# Patient Record
Sex: Male | Born: 1985
Health system: Southern US, Community
[De-identification: ages and names within clinical notes are randomized; demographics above are authoritative.]

## PROBLEM LIST (undated history)

## (undated) DIAGNOSIS — F200 Paranoid schizophrenia: Secondary | ICD-10-CM

## (undated) DIAGNOSIS — K5900 Constipation, unspecified: Secondary | ICD-10-CM

---

## 2003-11-14 ENCOUNTER — Emergency Department (HOSPITAL_COMMUNITY): Admission: EM | Admit: 2003-11-14 | Discharge: 2003-11-14 | Payer: Self-pay | Admitting: Emergency Medicine

## 2004-03-28 ENCOUNTER — Emergency Department (HOSPITAL_COMMUNITY): Admission: EM | Admit: 2004-03-28 | Discharge: 2004-03-28 | Payer: Self-pay | Admitting: Emergency Medicine

## 2004-12-18 ENCOUNTER — Emergency Department (HOSPITAL_COMMUNITY): Admission: EM | Admit: 2004-12-18 | Discharge: 2004-12-18 | Payer: Self-pay | Admitting: Emergency Medicine

## 2007-11-27 ENCOUNTER — Emergency Department (HOSPITAL_COMMUNITY): Admission: EM | Admit: 2007-11-27 | Discharge: 2007-11-27 | Payer: Self-pay | Admitting: Emergency Medicine

## 2010-11-30 LAB — CBC
Hemoglobin: 12.1 — ABNORMAL LOW
Platelets: 253
RBC: 4.38
RDW: 14

## 2010-11-30 LAB — BASIC METABOLIC PANEL
Creatinine, Ser: 0.92
GFR calc Af Amer: >60
GFR calc non Af Amer: >60
Glucose, Bld: 120 — ABNORMAL HIGH
Potassium: 2.9 — ABNORMAL LOW
Sodium: 141

## 2010-11-30 LAB — DIFFERENTIAL
Basophils Absolute: 0
Basophils Relative: 0
Eosinophils Absolute: 0
Eosinophils Relative: 0
Neutrophils Relative %: 74

## 2016-04-27 ENCOUNTER — Encounter (HOSPITAL_COMMUNITY): Payer: Self-pay | Admitting: Emergency Medicine

## 2016-04-27 ENCOUNTER — Emergency Department (HOSPITAL_COMMUNITY)
Admission: EM | Admit: 2016-04-27 | Discharge: 2016-04-29 | Disposition: A | Discharge status: Other Acute Inpatient Hospital | Payer: Self-pay

## 2016-04-27 DIAGNOSIS — Z5181 Encounter for therapeutic drug level monitoring: Secondary | ICD-10-CM | POA: Insufficient documentation

## 2016-04-27 DIAGNOSIS — F200 Paranoid schizophrenia: Secondary | ICD-10-CM | POA: Diagnosis present

## 2016-04-27 DIAGNOSIS — F322 Major depressive disorder, single episode, severe without psychotic features: Secondary | ICD-10-CM | POA: Insufficient documentation

## 2016-04-27 LAB — CBC
HCT: 36.1 % — ABNORMAL LOW (ref 39.0–52.0)
HEMOGLOBIN: 12.5 g/dL — AB (ref 13.0–17.0)
MCH: 27.4 pg (ref 26.0–34.0)
MCHC: 34.6 g/dL (ref 30.0–36.0)
MCV: 79.2 fL (ref 78.0–100.0)
Platelets: 173 10*3/uL (ref 150–400)
RBC: 4.56 MIL/uL (ref 4.22–5.81)
RDW: 13.5 % (ref 11.5–15.5)
WBC: 4 10*3/uL (ref 4.0–10.5)

## 2016-04-27 LAB — SALICYLATE LEVEL

## 2016-04-27 LAB — RAPID URINE DRUG SCREEN, HOSP PERFORMED
AMPHETAMINES: NOT DETECTED
Barbiturates: NOT DETECTED
Benzodiazepines: NOT DETECTED
COCAINE: NOT DETECTED
OPIATES: NOT DETECTED
TETRAHYDROCANNABINOL: NOT DETECTED

## 2016-04-27 LAB — COMPREHENSIVE METABOLIC PANEL
ALBUMIN: 4.6 g/dL (ref 3.5–5.0)
ALT: 15 U/L — AB (ref 17–63)
AST: 20 U/L (ref 15–41)
Alkaline Phosphatase: 60 U/L (ref 38–126)
Anion gap: 9 (ref 5–15)
BUN: 12 mg/dL (ref 6–20)
CHLORIDE: 101 mmol/L (ref 101–111)
CO2: 27 mmol/L (ref 22–32)
CREATININE: 0.9 mg/dL (ref 0.61–1.24)
Calcium: 9.6 mg/dL (ref 8.9–10.3)
GFR calc non Af Amer: >60 mL/min (ref >60)
GLUCOSE: 129 mg/dL — AB (ref 65–99)
Potassium: 3 mmol/L — ABNORMAL LOW (ref 3.5–5.1)
SODIUM: 137 mmol/L (ref 135–145)
Total Bilirubin: 0.5 mg/dL (ref 0.3–1.2)
Total Protein: 7.4 g/dL (ref 6.5–8.1)

## 2016-04-27 LAB — ETHANOL: Alcohol, Ethyl (B): <5 mg/dL (ref <5)

## 2016-04-27 LAB — ACETAMINOPHEN LEVEL: Acetaminophen (Tylenol), Serum: <10 ug/mL — ABNORMAL LOW (ref 10–30)

## 2016-04-27 MED ORDER — POTASSIUM CHLORIDE CRYS ER 20 MEQ PO TBCR
40.0000 meq | EXTENDED_RELEASE_TABLET | Freq: Once | ORAL | Status: AC
  Administered 2016-04-27: 40 meq via ORAL
  Filled 2016-04-27: qty 2

## 2016-04-27 MED ORDER — SODIUM CHLORIDE 0.9 % IV BOLUS (SEPSIS)
2000.0000 mL | Freq: Once | INTRAVENOUS | Status: AC
  Administered 2016-04-27: 2000 mL via INTRAVENOUS

## 2016-04-27 MED ORDER — SODIUM CHLORIDE 0.9 % IV SOLN
INTRAVENOUS | Status: DC
  Administered 2016-04-27: 17:00:00 via INTRAVENOUS

## 2016-04-27 MED ORDER — ONDANSETRON HCL 4 MG PO TABS: 4.0000 mg | ORAL_TABLET | Freq: Three times a day (TID) | ORAL | Status: DC | PRN

## 2016-04-27 MED ORDER — ACETAMINOPHEN 325 MG PO TABS: 650.0000 mg | ORAL_TABLET | ORAL | Status: DC | PRN

## 2016-04-27 MED ORDER — IBUPROFEN 200 MG PO TABS: 600.0000 mg | ORAL_TABLET | Freq: Three times a day (TID) | ORAL | Status: DC | PRN

## 2016-04-27 NOTE — Progress Notes (Signed)
ED CM  Noted patient not to  Have PCP or health insurance, will follow up for discharge needs.

## 2016-04-27 NOTE — Progress Notes (Signed)
CSW Followed up on remaining  bed placement referrals:  [facility]Being [facility]-denied due to perception that pt. requires LTC.  Timmothy EulerJean T. Kaylyn LimSutter, MSW, LCSWA Clinical Social Work Disposition [phone]

## 2016-04-27 NOTE — BH Assessment (Signed)
Assessment Note   Randy Fry is an 31 y.o. male who was brought to the ED under IVC by his brother for bizarre behavior, refusing to come to door or out of house, poor nutrition, hygiene and unsanitary living conditions. Pt was paranoid and could not appropriately answer most assessment questions and refused to answer others. Pt continued to say "It should be in my chart, check my chart" when writer asked him questions about why he is in the hospital. Pt does not have much history with the [facility] so there is not much background information on patient in chart at this time. Pt denies SI, HI or AVH but appears to be very paranoid, withdrawn and fearful of being in the hospital. When asked if he has ever seen a psychiatrist or been inpatient he stated that "he thinks so when he was an adolescent". He denies being treated for anything recently which was confirmed by his brother who took out the IVC papers. Pt states that he does not work and he goes out of his house "once a month". When asked how he is able to pay for things to survive he states that he "has income coming in". When writer asked him to elaborate he stated "it's nothing illegal" but would not say anything else. Pt denies SI or any previous attempts. He denies  HI or AVH however provider is concerned that he might be responding to internal stimuli. Pt is in an almost catatonic state, is slow moving and refusing to eat anything.   Collateral information: Writer called brother to get collateral information since pt was not able to successfully answer most questions. Brother Ludger Nutting Burkle [phone]) states that pt has withdrawn from family for about 5-6 years and will not interact with anyone other than to accept food every couple of weeks. He states that he had a break up with a girlfriend around this time and started to decline in basic functioning. Brother stated that the apartment he was living in at the time was dishevled,  unsanitary with food left out and maggots everywhere. He was moved to a house that his family owned (where he is residing now) and since then he will not communicate much with family. Brother stated that their mom has been diagnosed with Major Depressive Disorder and Schizophrenia and he is concerned that the patient might have similar issues. Brother IVC'd the patient because he would not answer the door when he went to check on him and when he finally got in the house was unsanitary with mold everywhere, slimy film on the floor and he was unkept with poor hygiene and would not speak to him. He states that he brought him to [facility] where he appeared paranoid and kept stating to the nurse to "check his chart" even though he had never been to [place] before. Brother stated that his IQ is average and he completed high school and some college. Brother is concerned that patient hasn't eaten in at least 2 weeks. Patient is still refusing to eat in hosptial. Brother states that pt has never been diagnosed or treated for psychiatric issues that he knows of even though he has presented with bizarre behavior for some time.   Disposition: inpatient recommended per Nanine Means NP.   Diagnosis: unspecified psychotic disorder, unspecified depressive disorder ( R/O major depressive disorder with psychotic features)   Past Medical History: History reviewed. No pertinent past medical history.  History reviewed. No pertinent surgical history.  Family History: No  family history on file.  Social History:  has no tobacco, alcohol, and drug history on file.  Additional Social History:  Alcohol / Drug Use History of alcohol / drug use?: No history of alcohol / drug abuse  CIWA: CIWA-Ar BP: 131/74 Pulse Rate: 119 COWS:    PATIENT STRENGTHS: (choose at least two) General fund of knowledge Supportive family/friends  Allergies: No Known Allergies  Home Medications:  (Not in a hospital admission)  OB/GYN  Status:  No LMP for male patient.  General Assessment Data [facility]: [facility] T[facility] Assessment: In system Is this a Tele or Face-to-Face Assessment?: Face-to-Face Is this an Initial Assessment or a Re-assessment for this encounter?: Initial Assessment Marital status: Single Living Arrangements: Alone Can pt return to current living arrangement?: Yes Admission Status: Involuntary Is patient capable of signing voluntary admission?: No Referral Source: Self/Family/Friend Insurance type:  (none)     Crisis Care Plan Living Arrangements: Alone Name of Psychiatrist: None Name of Therapist: None  Education Status Is patient currently in school?: No Highest grade of school patient has completed: yes  Risk to self with the past 6 months Suicidal Ideation: No Has patient been a risk to self within the past 6 months prior to admission? : No Suicidal Intent: No Has patient had any suicidal intent within the past 6 months prior to admission? : No Is patient at risk for suicide?: No Suicidal Plan?: No Has patient had any suicidal plan within the past 6 months prior to admission? : No Access to Means: No What has been your use of drugs/alcohol within the last 12 months?: denies Previous Attempts/Gestures: No How many times?: 0 Other Self Harm Risks: no Triggers for Past Attempts: None known Intentional Self Injurious Behavior: None Family Suicide History: No Recent stressful life event(s): Other (Comment) Persecutory voices/beliefs?: No Depression: Yes Depression Symptoms: Despondent Substance abuse history and/or treatment for substance abuse?: No Suicide prevention information given to non-admitted patients: Not applicable  Risk to Others within the past 6 months Homicidal Ideation: No Does patient have any lifetime risk of violence toward others beyond the six months prior to admission? : No Thoughts of Harm to Others: No Current Homicidal Intent: No Current  Homicidal Plan: No Access to Homicidal Means: No Identified Victim: None History of harm to others?: No Assessment of Violence: None Noted Violent Behavior Description: no Does patient have access to weapons?:  (unknown) Criminal Charges Pending?: No Does patient have a court date: No Is patient on probation?: No  Psychosis Hallucinations:  (Denies- provider concerned) Delusions:  (denies- provider concerned)  Mental Status Report Appearance/Hygiene: Bizarre Eye Contact: Poor Motor Activity: Freedom of movement, Restlessness Speech: Soft, Slow Level of Consciousness: Quiet/awake Mood: Suspicious Affect: Blunted Anxiety Level: Severe Thought Processes: Thought Blocking Judgement: Impaired Orientation: Not oriented Obsessive Compulsive Thoughts/Behaviors: Moderate  Cognitive Functioning Concentration: Decreased Memory: Recent Intact, Remote Impaired IQ: Average Insight: Fair Impulse Control: Fair Appetite: Poor (hasnt eaten in 2 weeks per brother) Weight Loss: 0 Weight Gain: 0 Sleep: Unable to Assess Vegetative Symptoms: Staying in bed, Not bathing, Decreased grooming  ADLScreening [facility]) Patient's cognitive ability adequate to safely complete daily activities?: Yes Patient able to express need for assistance with ADLs?: Yes Independently performs ADLs?: Yes (appropriate for developmental age)  Prior Inpatient Therapy Prior Inpatient Therapy: No  Prior Outpatient Therapy Prior Outpatient Therapy: No Does patient have an ACCT team?: No Does patient have Intensive In-House Services?  : No Does patient have [facility] services? :  No Does patient have P4CC services?: No  ADL Screening (condition at time of admission) Patient's cognitive ability adequate to safely complete daily activities?: Yes Is the patient deaf or have difficulty hearing?: No Does the patient have difficulty seeing, even when wearing glasses/contacts?: No Does the patient have  difficulty concentrating, remembering, or making decisions?: No Patient able to express need for assistance with ADLs?: Yes Does the patient have difficulty dressing or bathing?: No Independently performs ADLs?: Yes (appropriate for developmental age) Does the patient have difficulty walking or climbing stairs?: No Weakness of Legs: None Weakness of Arms/Hands: None  Home Assistive Devices/Equipment Home Assistive Devices/Equipment: None  Therapy Consults (therapy consults require a physician order) PT Evaluation Needed: No OT Evalulation Needed: No SLP Evaluation Needed: No Abuse/Neglect Assessment (Assessment to be complete while patient is alone) Physical Abuse: Denies Verbal Abuse: Denies Sexual Abuse: Denies Exploitation of patient/patient's resources: Denies Self-Neglect: Yes, present (Comment) Values / Beliefs Cultural Requests During Hospitalization: None Spiritual Requests During Hospitalization: None Consults Spiritual Care Consult Needed: No Social Work Consult Needed: No Merchant navy officer (For Healthcare) Does Patient Have a Medical Advance Directive?: No Would patient like information on creating a medical advance directive?: No - Patient declined Nutrition Screen- [facility] Patient's home diet: Regular Has the patient recently lost weight without trying?: No Has the patient been eating poorly because of a decreased appetite?: No Malnutrition Screening Tool Score: 0  Additional Information 1:1 In Past 12 Months?: No CIRT Risk: No Elopement Risk: No Does patient have medical clearance?: No     Disposition:  Disposition Initial Assessment Completed for this Encounter: Yes Disposition of Patient: Inpatient treatment program Type of inpatient treatment program: Adult  Cire Deyarmin 04/27/2016 3:06 PM

## 2016-04-27 NOTE — Progress Notes (Deleted)
Pt. Accepted to [facility]. but pt. requires a special bed that will need to be ordered in the AM before patient can be admitted to [facility]. CSW notified [facility] ED.    Oviya Ammar T. Vonya Ohalloran, MSW, LCSWA Clinical Social Work Disposition [phone] 

## 2016-04-27 NOTE — ED Notes (Signed)
Pt transferred from TCU, presents for evaluation after stopping communication with family.  Living space was unsafe and unsanitary.  Pt denies SI, HI or AVH.  Denies street drugs or alcohol use.  Denies feeling hopeless.  Denies previous history of mental illness.  Awake, alert & responsive, no distress noted.  Calm & cooperative, monitoring for safety, Q 15 min checks in effect.  Safety check for contraband completed, no items found.

## 2016-04-27 NOTE — Progress Notes (Signed)
[facility] is not appropriate for their acute stay facility due to their perception of the pt's need for long-term care.  Dorothe PeaJonathan F. Carlous Olivares, Theresia MajorsLCSWA, LCAS Clinical Social Worker Ph: [phone]

## 2016-04-27 NOTE — ED Notes (Signed)
[place] given to Latricia,[place] in [facility]. Pt moved to rm 35 with one patient belonging bag.

## 2016-04-27 NOTE — ED Notes (Signed)
Pt refusing to have any visitors at this time. Pt's brother, Benjamine Mola came to visit but pt refused to see him at this time. Pt does not want any information given to family members during his stay at the hospital.

## 2016-04-27 NOTE — BH Assessment (Signed)
[facility] Assessment Progress Note  Per Thedore MinsMojeed Akintayo, MD, this voluntary pt requires psychiatric hospitalization at this time.  The following facilities have been contacted to seek placement for this pt, with results as noted:  Beds available, information sent, decision pending:  [facility]   At capacity:  [facility]    Doylene Canninghomas Taimur Fier, [place] Triage Specialist [phone]

## 2016-04-27 NOTE — ED Provider Notes (Signed)
[facility] DEPT Provider Note   CSN: [id number] Arrival date & time: 04/27/16  1219     History   Chief Complaint Chief Complaint  Patient presents with  . IVC    HPI Exodus Skoog is a 31 y.o. male.  31 year old male who presents in her IVC due to not being take care of himself. According to the paperwork, patient is a danger to self due to his lack of eating as well as personal hygiene. He denies any prior psychiatric history. He is not very communicative at this time. He denies chest or abdominal discomfort. Denies any weakness. Denies any suicidal or homicidal ideations. Denies responding to internal stimuli. No auditory or visual hallucinations.      History reviewed. No pertinent past medical history.  There are no active problems to display for this patient.   History reviewed. No pertinent surgical history.     Home Medications    Prior to Admission medications   Not on File    Family History No family history on file.  Social History Social History  Substance Use Topics  . Smoking status: Not on file  . Smokeless tobacco: Not on file  . Alcohol use Not on file     Allergies   Patient has no known allergies.   Review of Systems Review of Systems  Unable to perform ROS: Psychiatric disorder     Physical Exam Updated Vital Signs BP 128/82 (BP Location: Left Arm)   Pulse (!) 135   Temp 98.4 F (36.9 C) (Oral)   Resp 20   Ht 5\' 8"  (1.727 m)   SpO2 100%   Physical Exam  Constitutional: He is oriented to person, place, and time. He appears well-developed and well-nourished.  Non-toxic appearance. No distress.  HENT:  Head: Normocephalic and atraumatic.  Eyes: Conjunctivae, EOM and lids are normal. Pupils are equal, round, and reactive to light.  Neck: Normal range of motion. Neck supple. No tracheal deviation present. No thyroid mass present.  Cardiovascular: Regular rhythm and normal heart sounds.  Tachycardia present.  Exam  reveals no gallop.   No murmur heard. Pulmonary/Chest: Effort normal and breath sounds normal. No stridor. No respiratory distress. He has no decreased breath sounds. He has no wheezes. He has no rhonchi. He has no rales.  Abdominal: Soft. Normal appearance and bowel sounds are normal. He exhibits no distension. There is no tenderness. There is no rebound and no CVA tenderness.  Musculoskeletal: Normal range of motion. He exhibits no edema or tenderness.  Neurological: He is alert and oriented to person, place, and time. He has normal strength. No cranial nerve deficit or sensory deficit. GCS eye subscore is 4. GCS verbal subscore is 5. GCS motor subscore is 6.  Skin: Skin is warm and dry. No abrasion and no rash noted.  Psychiatric: His speech is normal. His affect is blunt. He is withdrawn. He is inattentive.  Nursing note and vitals reviewed.    ED Treatments / Results  Labs (all labs ordered are listed, but only abnormal results are displayed) Labs Reviewed  COMPREHENSIVE METABOLIC PANEL  ETHANOL  SALICYLATE LEVEL  ACETAMINOPHEN LEVEL  CBC  RAPID URINE DRUG SCREEN, HOSP PERFORMED    EKG  EKG Interpretation None       Radiology No results found.  Procedures Procedures (including critical care time)  Medications Ordered in ED Medications - No data to display   Initial Impression / Assessment and Plan / ED Course  I have reviewed the  triage vital signs and the nursing notes.  Pertinent labs & imaging results that were available during my care of the patient were reviewed by me and considered in my medical decision making (see chart for details).     Patient's tachycardia noted. Suspect that he is dehydrated. Patient to be rehydrated. t. Labs are pending at this time.  3:03 PM Patient reassessed and appears to be somewhat paranoid. Suspect this also consider being to his tachycardia. Medically clear for psychiatric evaluation.  Final Clinical Impressions(s) / ED  Diagnoses   Final diagnoses:  None    New Prescriptions New Prescriptions   No medications on file     Lorre NickAnthony Jasyn Mey, MD 04/27/16 [phone]

## 2016-04-27 NOTE — ED Triage Notes (Signed)
Patient BIB [facility], IVC paperwork states "the respondent stopped communicating regularly with his family a few years ago. Recently, the respondent refused to come out of his home or answer the door for his family members. After attempting to make contact numerous time, the petitioner finally convinced the respondent to open the door for him. The petitioner observed a space that was unsafe and unsanitary. According to the petitioner, mold and mildew is growing various places in the home, there is no food in the refrigerator and a sink is missing in the bathroom. According to the petitioner, respondent is not eating or tending to personal hygiene and he sleeps a lot. The respondent refuses to communicate at a meaningful level, stares off into space and will not deal with the reality of his situation. As a result, the respondent is a danger to himself."

## 2016-04-28 DIAGNOSIS — F200 Paranoid schizophrenia: Secondary | ICD-10-CM

## 2016-04-28 DIAGNOSIS — Z79899 Other long term (current) drug therapy: Secondary | ICD-10-CM

## 2016-04-28 MED ORDER — MIRTAZAPINE 7.5 MG PO TABS
7.5000 mg | ORAL_TABLET | Freq: Every day | ORAL | Status: DC
  Administered 2016-04-28: 7.5 mg via ORAL
  Filled 2016-04-28: qty 1

## 2016-04-28 MED ORDER — HALOPERIDOL 5 MG PO TABS
5.0000 mg | ORAL_TABLET | Freq: Two times a day (BID) | ORAL | Status: DC
  Administered 2016-04-28 – 2016-04-29 (×3): 5 mg via ORAL
  Filled 2016-04-28 (×3): qty 1

## 2016-04-28 MED ORDER — ASENAPINE MALEATE 5 MG SL SUBL: 5.0000 mg | SUBLINGUAL_TABLET | Freq: Two times a day (BID) | SUBLINGUAL | Status: DC | PRN

## 2016-04-28 MED ORDER — BENZTROPINE MESYLATE 0.5 MG PO TABS
0.5000 mg | ORAL_TABLET | Freq: Two times a day (BID) | ORAL | Status: DC
  Administered 2016-04-28 – 2016-04-29 (×3): 0.5 mg via ORAL
  Filled 2016-04-28 (×3): qty 1

## 2016-04-28 NOTE — Care Management (Signed)
ED CM received call from patient's brother Randy Fry, brother states, that patient has a long  hx of psychiatric issues and had been on [place] in [place]. [facility] BH [facility]. Family moved patient here several months ago after family stepping in to assist with care.  Brother Randy Fry rented an apartment in which the brother pays the expenses. Brother reports going over to check on him yesterday and he was lying on the floor weak unable to get up without assistance.  Patient told brother he had not eaten in over month, and was not able to answer questions appropriately. [facility] and he refused to answer questions and was discharged. Brother felt that he is no longer able to care for self and would like to explore group home placement.

## 2016-04-28 NOTE — BH Assessment (Signed)
[facility] Assessment Progress Note  Per Thedore MinsMojeed Akintayo, MD, this pt continues to require psychiatric hospitalization at this time.  The following facilities have been contacted to seek placement for this pt, with results as noted:  Beds available, information sent, decision pending:  [facility]   Declined:  [facility] (due to [facility] acuity) [facility] (due to need for long term care) [facility] (due to pt acuity)   At capacity:  [facility]  Lineth Thielke, [place] Triage Specialist [phone]

## 2016-04-28 NOTE — ED Notes (Signed)
Patient has been in his room most of the day.  He comes out to use the bathroom.  He has been asked several times today to shower and he has not done so yet.  He was given all the supplies he needs and was given shower shoes per his request.  He denies thoughts of harm to self or others.  He denies auditory or visual hallucinations, but does seem to be responding to internal stimuli at times.  He was started on haloperidol which he took without incident.

## 2016-04-28 NOTE — ED Notes (Signed)
Patients brother, Randy NuttingDevon, phone number is [phone].

## 2016-04-28 NOTE — ED Notes (Signed)
Pt awake, alert & responsive, no distress noted, calm & cooperative at present.  Watching TV at present.  Monitoring for safety, Q 15 min checks in effect.

## 2016-04-28 NOTE — Consult Note (Signed)
Psychiatry Consult   Reason for Consult: psychiatric evaluation Referring Physician: EDP Patient Identification: Randy Fry MRN:  [id number] Principal Diagnosis: Paranoid schizophrenia ([place]) Diagnosis:   Patient Active Problem List   Diagnosis Date Noted  . Paranoid schizophrenia ([place]) [F20.0] 04/28/2016    Priority: High    Total Time spent with patient: 45 minutes  Subjective:   Randy Fry is a 31 y.o. male patient admitted with bizarre behavior.  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 Patient has long history of mental illness with prior inpatient psychiatric admission to an hospital in [place]. Patient was brought to the [facility] under IVC by his brother for bizarre behavior, refusing to come to door or out of house and self isolation. Patient has not been attending to his personal hygiene and living in an unsanitary conditions. Patient is paranoid, refusing to answer questions except that he wants to be discharged back to his home. He is not currently taking any medications, he is withdrawn, with no interaction with family members. Patient is observed starring into the space and talking to himself as if responding to stimuli.  Past Psychiatric History: as above  Risk to Self: Suicidal Ideation: No Suicidal Intent: No Is patient at risk for suicide?: No Suicidal Plan?: No Access to Means: No What has been your use of drugs/alcohol within the last 12 months?: denies How many times?: 0 Other Self Harm Risks: no Triggers for Past Attempts: None known Intentional Self Injurious Behavior: None Risk to Others: Homicidal Ideation: No Thoughts of Harm to Others: No Current Homicidal Intent: No Current Homicidal Plan: No Access to Homicidal Means: No Identified Victim: None History of harm to others?: No Assessment of Violence: None Noted Violent Behavior Description:  no Does patient have access to weapons?:  (unknown) Criminal Charges Pending?: No Does patient have a court date: No Prior Inpatient Therapy: Prior Inpatient Therapy: No Prior Outpatient Therapy: Prior Outpatient Therapy: No Does patient have an ACCT team?: No Does patient have Intensive In-House Services?  : No Does patient have [facility] services? : No Does patient have P4CC services?: No  Past Medical History: History reviewed. No pertinent past medical history. History reviewed. No pertinent surgical history. Family History: No family history on file. Family Psychiatric  History:  Social History:  History  Alcohol use Not on file     History  Drug use: Unknown    Social History   Social History  . Marital status: Married    Spouse name: N/A  . Number of children: N/A  . Years of education: N/A   Social History Main Topics  . Smoking status: None  . Smokeless tobacco: None  . Alcohol use None  . Drug use: Unknown  . Sexual activity: Not Asked   Other Topics Concern  . None   Social History Narrative  . None   Additional Social History:    Allergies:  No Known Allergies  Labs:  Results for orders placed or performed during the hospital encounter of 04/27/16 (from the past 48 hour(s))  Rapid urine drug screen (hospital performed)     Status: None   Collection Time: 04/27/16 12:37 PM  Result Value Ref Range   Opiates NONE DETECTED NONE DETECTED   Cocaine NONE DETECTED NONE DETECTED   Benzodiazepines NONE DETECTED NONE DETECTED   Amphetamines NONE DETECTED NONE DETECTED   Tetrahydrocannabinol NONE DETECTED NONE DETECTED  Barbiturates NONE DETECTED NONE DETECTED    Comment:        DRUG SCREEN FOR MEDICAL PURPOSES ONLY.  IF CONFIRMATION IS NEEDED FOR ANY PURPOSE, NOTIFY LAB WITHIN 5 DAYS.        LOWEST DETECTABLE LIMITS FOR URINE DRUG SCREEN Drug Class       Cutoff (ng/mL) Amphetamine      1000 Barbiturate      200 Benzodiazepine   160 Tricyclics        737 Opiates          300 Cocaine          300 THC              50   Comprehensive metabolic panel     Status: Abnormal   Collection Time: 04/27/16 12:57 PM  Result Value Ref Range   Sodium 137 135 - 145 mmol/L   Potassium 3.0 (L) 3.5 - 5.1 mmol/L   Chloride 101 101 - 111 mmol/L   CO2 27 22 - 32 mmol/L   Glucose, Bld 129 (H) 65 - 99 mg/dL   BUN 12 6 - 20 mg/dL   Creatinine, Ser 0.90 0.61 - 1.24 mg/dL   Calcium 9.6 8.9 - 10.3 mg/dL   Total Protein 7.4 6.5 - 8.1 g/dL   Albumin 4.6 3.5 - 5.0 g/dL   AST 20 15 - 41 U/L   ALT 15 (L) 17 - 63 U/L   Alkaline Phosphatase 60 38 - 126 U/L   Total Bilirubin 0.5 0.3 - 1.2 mg/dL   GFR calc non Af Amer >60 >60 mL/min   GFR calc Af Amer >60 >60 mL/min    Comment: (NOTE) The eGFR has been calculated using the CKD EPI equation. This calculation has not been validated in all clinical situations. eGFR's persistently <60 mL/min signify possible Chronic Kidney Disease.    Anion gap 9 5 - 15  Ethanol     Status: None   Collection Time: 04/27/16 12:57 PM  Result Value Ref Range   Alcohol, Ethyl (B) <5 <5 mg/dL    Comment:        LOWEST DETECTABLE LIMIT FOR SERUM ALCOHOL IS 5 mg/dL FOR MEDICAL PURPOSES ONLY   Salicylate level     Status: None   Collection Time: 04/27/16 12:57 PM  Result Value Ref Range   Salicylate Lvl <1.0 2.8 - 30.0 mg/dL  Acetaminophen level     Status: Abnormal   Collection Time: 04/27/16 12:57 PM  Result Value Ref Range   Acetaminophen (Tylenol), Serum <10 (L) 10 - 30 ug/mL    Comment:        THERAPEUTIC CONCENTRATIONS VARY SIGNIFICANTLY. A RANGE OF 10-30 ug/mL MAY BE AN EFFECTIVE CONCENTRATION FOR MANY PATIENTS. HOWEVER, SOME ARE BEST TREATED AT CONCENTRATIONS OUTSIDE THIS RANGE. ACETAMINOPHEN CONCENTRATIONS >150 ug/mL AT 4 HOURS AFTER INGESTION AND >50 ug/mL AT 12 HOURS AFTER INGESTION ARE OFTEN ASSOCIATED WITH TOXIC REACTIONS.   cbc     Status: Abnormal   Collection Time: 04/27/16 12:57 PM  Result Value  Ref Range   WBC 4.0 4.0 - 10.5 K/uL   RBC 4.56 4.22 - 5.81 MIL/uL   Hemoglobin 12.5 (L) 13.0 - 17.0 g/dL   HCT 36.1 (L) 39.0 - 52.0 %   MCV 79.2 78.0 - 100.0 fL   MCH 27.4 26.0 - 34.0 pg   MCHC 34.6 30.0 - 36.0 g/dL   RDW 13.5 11.5 - 15.5 %   Platelets 173 150 - 400 K/uL  Current Facility-Administered Medications  Medication Dose Route Frequency Provider Last Rate Last Dose  . 0.9 %  sodium chloride infusion   Intravenous Continuous Lacretia Leigh, MD   Stopped at 04/27/16 1951  . acetaminophen (TYLENOL) tablet 650 mg  650 mg Oral Q4H PRN Lacretia Leigh, MD      . asenapine (SAPHRIS) sublingual tablet 5 mg  5 mg Sublingual Q12H PRN Corena Pilgrim, MD      . benztropine (COGENTIN) tablet 0.5 mg  0.5 mg Oral BID Sho Salguero, MD      . haloperidol (HALDOL) tablet 5 mg  5 mg Oral BID Cannan Beeck, MD      . ibuprofen (ADVIL,MOTRIN) tablet 600 mg  600 mg Oral Q8H PRN Lacretia Leigh, MD      . mirtazapine (REMERON) tablet 7.5 mg  7.5 mg Oral QHS Evren Shankland, MD      . ondansetron (ZOFRAN) tablet 4 mg  4 mg Oral Q8H PRN Lacretia Leigh, MD       No current outpatient prescriptions on file.    Musculoskeletal: Strength & Muscle Tone: within normal limits Gait & Station: normal Patient leans: N/A  Psychiatric Specialty Exam: Physical Exam  Psychiatric: Judgment and thought content normal. His affect is blunt. His speech is delayed. He is slowed, withdrawn and actively hallucinating. Cognition and memory are normal.    Review of Systems  Constitutional: Positive for malaise/fatigue.  HENT: Negative.   Eyes: Negative.   Respiratory: Negative.   Cardiovascular: Negative.   Gastrointestinal: Negative.   Genitourinary: Negative.   Musculoskeletal: Negative.   Skin: Negative.   Neurological: Negative.   Endo/Heme/Allergies: Negative.   Psychiatric/Behavioral: Positive for depression and hallucinations.    Blood pressure 113/68, pulse 109, temperature 98.1 F (36.7 C),  temperature source Oral, resp. rate 20, height '5\' 8"'  (1.727 m), SpO2 100 %.There is no height or weight on file to calculate BMI.  General Appearance: Disheveled  Eye Contact:  Minimal  Speech:  Slow  Volume:  Decreased  Mood:  Depressed and Dysphoric  Affect:  Constricted  Thought Process:  Disorganized  Orientation:  Full (Time, Place, and Person)  Thought Content:  Illogical, Delusions and Hallucinations: Auditory  Suicidal Thoughts:  No  Homicidal Thoughts:  No  Memory:  unable to assess  Judgement:  Poor  Insight:  Lacking  Psychomotor Activity:  Psychomotor Retardation  Concentration:  Concentration: Poor and Attention Span: Fair  Recall:  Poor  Fund of Knowledge:  unable to assess  Language:  Fair  Akathisia:  No  Handed:  Right  AIMS (if indicated):     Assets:  Social Support  ADL's:  Impaired  Cognition:  WNL  Sleep:   fair     Treatment Plan Summary: Daily contact with patient to assess and evaluate symptoms and progress in treatment and Medication management  Start Haldol 2 mg bid for schizophrenia. Start Benztropine 0.5 mg bid for EPS prevention Start Remeron 7.5 mg Qhs for depression and appetite stimulation  Disposition: Recommend psychiatric Inpatient admission when medically cleared.  Corena Pilgrim, MD 04/28/2016 10:07 AM

## 2016-04-28 NOTE — Progress Notes (Signed)
04/28/16 1358:  LRT introduced self to pt and offered activities, pt declined.  Caroll RancherMarjette Khamarion Bjelland, LRT/CTRS

## 2016-04-29 MED ORDER — POTASSIUM CHLORIDE CRYS ER 20 MEQ PO TBCR
40.0000 meq | EXTENDED_RELEASE_TABLET | Freq: Once | ORAL | Status: AC
  Administered 2016-04-29: 40 meq via ORAL
  Filled 2016-04-29: qty 2

## 2016-04-29 NOTE — ED Notes (Signed)
Pt discharged ambulatory with Sheriff deputy.  Pt was calm and cooperative.  All belongings were sent with patient. 

## 2016-04-29 NOTE — Progress Notes (Signed)
04/29/16 1352:  LRT went to pt room to offer activities, pt declined.  Caroll RancherMarjette  Armanie Martine, LRT/CTRS

## 2016-04-29 NOTE — BH Assessment (Signed)
[facility] Assessment Progress Note  Per Thedore MinsMojeed Akintayo, MD, this pt requires psychiatric hospitalization at this time.  Pt presents under IVC initiated by his brother, which Dr Jannifer FranklinAkintayo has upheld.  At 15:18 Morrie Sheldonshley calls from [facility] to report that pt has been accepted to their facility by Dr Wendall StadeKohl.  EDP Shaune Pollackameron Isaacs, MDconcurs with this decision.  Pt's nurse has been notified, and agrees to call report to [phone].  [facility]  Doylene Canninghomas Clois Treanor, MA Triage Specialist [phone]

## 2016-04-29 NOTE — Care Management (Signed)
ED CM notes Patient still awaits placement.

## 2016-04-29 NOTE — BH Assessment (Signed)
Reassessment:   Writer met with patient face to face to complete a TTS assessment. Patient brought to the ED 04/27/2016 under IVC by his brother for bizarre behavior, refusing to come to door or out of house, poor nutrition, hygiene and unsanitary living conditions. Pt appears paranoid and anxious on this day. He reportedly could not appropriately answer most assessment questions and refused to answer others upon arrival. Today patient answered all questions appropriately. He also asked questions such as, "Where will I be placed?", "Will it be at a local hospital?", and "Will I have transportation back?". Patient sts that he attends school @ [employer] is concerned about missing days.  Pt denies SI, HI or AVH but appears to be very paranoid, withdrawn and fearful of being sent to another hospital. Patient stating that he did get some sleep last night. He also sts that he ate a little breakfast this morning. Patient is dressed in scrubs. He has a flat affect. He was calm and cooperative. His mood is labile. He is oriented to person and  place but not time and situation.

## 2016-05-05 MED FILL — risperiDONE 0.5 MG TABS: 0.5 | 30 days supply | Qty: 60 | Fill #0

## 2016-05-05 MED FILL — ESCITALOPRAM 10 MG TABLET: 10 | 30 days supply | Qty: 30 | Fill #0

## 2016-09-07 ENCOUNTER — Emergency Department (HOSPITAL_COMMUNITY)
Admission: EM | Admit: 2016-09-07 | Discharge: 2016-09-07 | Disposition: A | Discharge status: Home / Self Care | Payer: Self-pay | Attending: Emergency Medicine | Admitting: Emergency Medicine

## 2016-09-07 ENCOUNTER — Encounter (HOSPITAL_COMMUNITY): Payer: Self-pay

## 2016-09-07 DIAGNOSIS — F200 Paranoid schizophrenia: Secondary | ICD-10-CM | POA: Insufficient documentation

## 2016-09-07 DIAGNOSIS — Z760 Encounter for issue of repeat prescription: Secondary | ICD-10-CM | POA: Insufficient documentation

## 2016-09-07 NOTE — ED Triage Notes (Signed)
Pt presents to the ed with complaints of wanting "vitamins to be healthy" and of wanting information on if he was here feb 27 incase someone stole his identity. Pt is alert and oriented. Denies any symptoms at all.

## 2016-09-07 NOTE — Discharge Instructions (Signed)
The following vitamins are suitable to take: Centrum Silver, One A Day Mens, NatureMade Multi For Him Return to ED for chest pain, trouble breathing, vomiting, vision changes, injury or falls.

## 2016-09-07 NOTE — ED Notes (Signed)
Pt is in stable condition upon d/c and ambulates from ED. 

## 2016-09-07 NOTE — ED Provider Notes (Signed)
[facility]EMERGENCY DEPT Provider Note   CSN: [id number] Arrival date & time: 09/07/16  1549  By signing my name below, I, Vista Minkobert Ross, attest that this documentation has been prepared under the direction and in the presence of Imogine Carvell PA-C.  Electronically Signed: Vista Minkobert Ross, ED Scribe. 09/07/16. 6:40 PM.  History   Chief Complaint Chief Complaint  Patient presents with  . dehydrated  . multiple complaints   HPI HPI Comments: Gerda DissDwayne Conkey is a 31 y.o. male who presents to the Emergency Department requesting information about vitamin supplements. Pt states "I want vitamins to be healthy". Pt does not have any symptoms currently. He does not have any chronic health problems. He does not take any medications on a daily basis. He denies any abdominal pain, nausea, vomiting.   The history is provided by the patient. No language interpreter was used.    History reviewed. No pertinent past medical history.  Patient Active Problem List   Diagnosis Date Noted  . Paranoid schizophrenia (HCC) 04/28/2016    History reviewed. No pertinent surgical history.   Home Medications    Prior to Admission medications   Not on File    Family History No family history on file.  Social History Social History  Substance Use Topics  . Smoking status: Never Smoker  . Smokeless tobacco: Never Used  . Alcohol use Not on file     Allergies   Patient has no known allergies.   Review of Systems Review of Systems  Constitutional: Negative for appetite change, chills and fever.  Cardiovascular: Negative for chest pain.  Gastrointestinal: Negative for abdominal pain, nausea and vomiting.     Physical Exam Updated Vital Signs BP 121/70 (BP Location: Right Arm)   Pulse 72   Temp 98.6 F (37 C) (Oral)   Resp 18   Ht 5\' 9"  (1.753 m)   Wt 145 lb (65.8 kg)   SpO2 99%   BMI 21.41 kg/m   Physical Exam  Constitutional: He appears well-developed and well-nourished. No distress.    HENT:  Head: Normocephalic and atraumatic.  Eyes: Conjunctivae and EOM are normal. No scleral icterus.  Neck: Normal range of motion.  Cardiovascular: Normal rate and regular rhythm.   Pulmonary/Chest: Effort normal and breath sounds normal. No respiratory distress. He has no wheezes.  Neurological: He is alert.  Skin: No rash noted. He is not diaphoretic.  Psychiatric: He has a normal mood and affect.  Nursing note and vitals reviewed.    ED Treatments / Results  DIAGNOSTIC STUDIES: Oxygen Saturation is 99% on RA, normal by my interpretation.  COORDINATION OF CARE: 6:38 PM-Discussed treatment plan with pt at bedside and pt agreed to plan.   Labs (all labs ordered are listed, but only abnormal results are displayed) Labs Reviewed - No data to display  EKG  EKG Interpretation None       Radiology No results found.  Procedures Procedures (including critical care time)  Medications Ordered in ED Medications - No data to display   Initial Impression / Assessment and Plan / ED Course  I have reviewed the triage vital signs and the nursing notes.  Pertinent labs & imaging results that were available during my care of the patient were reviewed by me and considered in my medical decision making (see chart for details).     Patient presents to ED for recommendations of which vitamins he should take. He currently denies any symptoms. He denies any chronic medical issues, acute medical issues,  current medication use or allergies. He is not in respiratory distress and is tolerating secretions. He is nontoxic appearing and is not acute distress.he is afebrile with no history of fever. Other vital signs within normal limits. I recommended several vitamin options for patient and patient agreed that he would pick them up over the counter as soon as possible. Patient appears stable for discharge at this time. Strict return precautions given.  Final Clinical Impressions(s) / ED  Diagnoses   Final diagnoses:  Medication refill    New Prescriptions There are no discharge medications for this patient. I personally performed the services described in this documentation, which was scribed in my presence. The recorded information has been reviewed and is accurate.     Dietrich Pates, PA-C 09/07/16 1904    Vanetta Mulders, MD 09/10/16 [id number]

## 2016-09-09 ENCOUNTER — Encounter (HOSPITAL_COMMUNITY): Payer: Self-pay | Admitting: Emergency Medicine

## 2016-09-09 ENCOUNTER — Emergency Department (HOSPITAL_COMMUNITY)
Admission: EM | Admit: 2016-09-09 | Discharge: 2016-09-09 | Disposition: A | Discharge status: Home / Self Care | Payer: Self-pay | Attending: Emergency Medicine | Admitting: Emergency Medicine

## 2016-09-09 DIAGNOSIS — L853 Xerosis cutis: Secondary | ICD-10-CM | POA: Insufficient documentation

## 2016-09-09 HISTORY — DX: Paranoid schizophrenia: F20.0

## 2016-09-09 MED ORDER — EUCERIN EX CREA: TOPICAL_CREAM | CUTANEOUS | 0 refills | Status: DC | PRN

## 2016-09-09 NOTE — ED Triage Notes (Addendum)
Pt c/o itching and redness to bilateral feet/ankles x 1 week.  Pt wearing sandals and has large holes in socks.  New pair of socks given to pt.  Questioned pt about medical history and he states it is in the chart.  No history was showing.  Asked pt again and he states it is private.  Pt denies psych history however last visit showed paranoid schizophrenia in EDP note.

## 2016-09-09 NOTE — ED Notes (Signed)
Security asked pt to leave at 11:30pm because he was sitting in peds waiting in the corner and is not a patient.  He has on an armband from 2 days ago.  Pt was given a bus pass and walked to bus stop by security.  Security states the last bus runs at midnight.  Pt came back to ED to check in at 0010.  Questioned pt why he didn't check in earlier while sitting in waiting room and he states he doesn't know.  States he came here around 8pm and has been sitting in waiting area.

## 2016-09-09 NOTE — ED Provider Notes (Signed)
  [facility] Provider Note   CSN: [id number] Arrival date & time: 09/09/16  0011     History   Chief Complaint Chief Complaint  Patient presents with  . feet itching    HPI Randy Fry is a 31 y.o. male.  Patient presents emergency department with chief complaint of itchy feet. He states that his feet began itching one week ago. He has not applied anything to his feet. He denies any injury. He denies any fevers chills. There are no modifying factors. There are no other associated symptoms.   The history is provided by the patient. No language interpreter was used.    Past Medical History:  Diagnosis Date  . Paranoid schizophrenia [facility](HCC)     Patient Active Problem List   Diagnosis Date Noted  . Paranoid schizophrenia (HCC) 04/28/2016    History reviewed. No pertinent surgical history.     Home Medications    Prior to Admission medications   Medication Sig Start Date End Date Taking? Authorizing Provider  Skin Protectants, Misc. (EUCERIN) cream Apply topically as needed for dry skin. 09/09/16   Roxy HorsemanBrowning, Kambri Dismore, PA-C    Family History No family history on file.  Social History Social History  Substance Use Topics  . Smoking status: Never Smoker  . Smokeless tobacco: Never Used  . Alcohol use No     Allergies   Patient has no known allergies.   Review of Systems Review of Systems  All other systems reviewed and are negative.    Physical Exam Updated Vital Signs BP 124/72 (BP Location: Right Arm)   Pulse 86   Temp 98.1 F (36.7 C) (Oral)   Resp 20   SpO2 99%   Physical Exam  Constitutional: He is oriented to person, place, and time. He appears well-developed and well-nourished.  HENT:  Head: Normocephalic and atraumatic.  Eyes: Conjunctivae and EOM are normal.  Neck: Normal range of motion.  Cardiovascular: Normal rate.   Pulmonary/Chest: Effort normal.  Abdominal: He exhibits no distension.  Musculoskeletal: Normal range of  motion.  Neurological: He is alert and oriented to person, place, and time.  Skin: Skin is dry.  Dry skin on bilateral lower extremities, no evidence of tinea, no evidence of cellulitis  Psychiatric: He has a normal mood and affect. His behavior is normal. Judgment and thought content normal.  Nursing note and vitals reviewed.    ED Treatments / Results  Labs (all labs ordered are listed, but only abnormal results are displayed) Labs Reviewed - No data to display  EKG  EKG Interpretation None       Radiology No results found.  Procedures Procedures (including critical care time)  Medications Ordered in ED Medications - No data to display   Initial Impression / Assessment and Plan / ED Course  I have reviewed the triage vital signs and the nursing notes.  Pertinent labs & imaging results that were available during my care of the patient were reviewed by me and considered in my medical decision making (see chart for details).     Patient with itchy feet. No evidence of infection. No evidence of tinea. Will treat with topical emollient.  Final Clinical Impressions(s) / ED Diagnoses   Final diagnoses:  Dry skin    New Prescriptions New Prescriptions   SKIN PROTECTANTS, MISC. (EUCERIN) CREAM    Apply topically as needed for dry skin.     Roxy HorsemanBrowning, Talor Cheema, PA-C 09/09/16 [id number]    Pricilla LovelessGoldston, Scott, MD 09/09/16 1006

## 2016-12-15 ENCOUNTER — Emergency Department (HOSPITAL_COMMUNITY)
Admission: EM | Admit: 2016-12-15 | Discharge: 2016-12-15 | Disposition: A | Discharge status: Home / Self Care | Payer: Self-pay | Attending: Emergency Medicine | Admitting: Emergency Medicine

## 2016-12-15 DIAGNOSIS — L853 Xerosis cutis: Secondary | ICD-10-CM | POA: Insufficient documentation

## 2016-12-15 DIAGNOSIS — R638 Other symptoms and signs concerning food and fluid intake: Secondary | ICD-10-CM | POA: Insufficient documentation

## 2016-12-15 NOTE — Discharge Instructions (Signed)
Apply lotion to affected area as needed. [facility] for [facility]. Return to ED for worsening symptoms, leg swelling, falls or injuries.

## 2016-12-15 NOTE — ED Notes (Signed)
Pt stable, ambulatory, and expresses understanding of D/C instructions   

## 2016-12-15 NOTE — ED Notes (Signed)
Pt states his ankles are swollen and itching.  No notable swelling observed.  Pt also states he would like food.

## 2016-12-15 NOTE — ED Provider Notes (Signed)
[facility] EMERGENCY DEPARTMENT Provider Note   CSN: [id number] Arrival date & time: 12/15/16  2116     History   Chief Complaint Chief Complaint  Patient presents with  . Wants Food    HPI Randy Fry is a 31 y.o. male with past medical history of schizophrenia presents to ED for evaluation of wanting vitamins and dry skin on bilateral ankles. He was seen in ED for same. He has not tried any lotions to help with his symptoms. He denies any other symptoms including no fever, chills, injuries, swelling of joints.  HPI  Past Medical History:  Diagnosis Date  . Paranoid schizophrenia [facility])     Patient Active Problem List   Diagnosis Date Noted  . Paranoid schizophrenia (HCC) 04/28/2016    No past surgical history on file.     Home Medications    Prior to Admission medications   Medication Sig Start Date End Date Taking? Authorizing Provider  Skin Protectants, Misc. (EUCERIN) cream Apply topically as needed for dry skin. 09/09/16   Roxy Horseman, PA-C    Family History No family history on file.  Social History Social History  Substance Use Topics  . Smoking status: Never Smoker  . Smokeless tobacco: Never Used  . Alcohol use No     Allergies   Patient has no known allergies.   Review of Systems Review of Systems  Constitutional: Negative for chills and fever.  Gastrointestinal: Negative for nausea and vomiting.  Skin: Positive for rash.  Neurological: Negative for weakness and numbness.     Physical Exam Updated Vital Signs BP 137/82 (BP Location: Right Arm)   Pulse (!) 132   Temp 98.2 F (36.8 C) (Oral)   Resp 18   SpO2 100%   Physical Exam  Constitutional: He appears well-developed and well-nourished. No distress.  HENT:  Head: Normocephalic and atraumatic.  Eyes: Conjunctivae and EOM are normal. No scleral icterus.  Neck: Normal range of motion.  Pulmonary/Chest: Effort normal. No respiratory distress.    Neurological: He is alert.  Skin: No rash noted. He is not diaphoretic.  Dry skin noted to bilateral lower extremities. No overlying cellulitis, crusting or signs of infection noted.  Psychiatric: He has a normal mood and affect.  Nursing note and vitals reviewed.    ED Treatments / Results  Labs (all labs ordered are listed, but only abnormal results are displayed) Labs Reviewed - No data to display  EKG  EKG Interpretation None       Radiology No results found.  Procedures Procedures (including critical care time)  Medications Ordered in ED Medications - No data to display   Initial Impression / Assessment and Plan / ED Course  I have reviewed the triage vital signs and the nursing notes.  Pertinent labs & imaging results that were available during my care of the patient were reviewed by me and considered in my medical decision making (see chart for details).     Patient presents to ED for evaluation of dry skin to bilateral lower extremities and wanting vitamins. He states that his dry skin has been present for several months. Physical exam he does have some dry skin noted to bilateral lower extremities with no cellulitis, color change, temperature change, crusting, signs of infection noted. He is overall well-appearing. He states that he will also like vitamins. I told him that we are not able to prescribe any but  Did give him the names a few vitamins that he  can try over-the-counter. Patient denies any other complaints at this time. Patient given lotion here in the ED. Patient appears stable for discharge at this time. Strict return precautions given.  Final Clinical Impressions(s) / ED Diagnoses   Final diagnoses:  Dry skin    New Prescriptions New Prescriptions   No medications on file     Dietrich PatesKhatri, Tonetta Napoles, PA-C 12/15/16 2250    Rolland PorterJames, Mark, MD 12/20/16 2240

## 2016-12-15 NOTE — ED Notes (Signed)
Called for room, no answer

## 2016-12-15 NOTE — ED Triage Notes (Signed)
Pt states "I'm here for the same thing as last time" I asked pt what that was and he says "dietary and my ankles are swollen"  Pt is here bc he wants food... Hx of schizophrenia.

## 2016-12-15 NOTE — ED Notes (Signed)
Gave pt lotion for dry skin. Gave pt bag meal and drink.

## 2017-02-04 ENCOUNTER — Inpatient Hospital Stay (HOSPITAL_COMMUNITY)
Admission: EM | Admit: 2017-02-04 | Discharge: 2017-02-11 | DRG: 641 | Disposition: A | Discharge status: Home / Self Care | Payer: Self-pay | Attending: Internal Medicine | Admitting: Internal Medicine

## 2017-02-04 ENCOUNTER — Encounter (HOSPITAL_COMMUNITY): Payer: Self-pay | Admitting: Emergency Medicine

## 2017-02-04 DIAGNOSIS — K5900 Constipation, unspecified: Secondary | ICD-10-CM | POA: Diagnosis present

## 2017-02-04 DIAGNOSIS — R000 Tachycardia, unspecified: Secondary | ICD-10-CM

## 2017-02-04 DIAGNOSIS — Z23 Encounter for immunization: Secondary | ICD-10-CM

## 2017-02-04 DIAGNOSIS — E872 Acidosis: Secondary | ICD-10-CM | POA: Diagnosis present

## 2017-02-04 DIAGNOSIS — R509 Fever, unspecified: Secondary | ICD-10-CM | POA: Diagnosis present

## 2017-02-04 DIAGNOSIS — F200 Paranoid schizophrenia: Secondary | ICD-10-CM | POA: Diagnosis present

## 2017-02-04 DIAGNOSIS — E874 Mixed disorder of acid-base balance: Principal | ICD-10-CM | POA: Diagnosis present

## 2017-02-04 DIAGNOSIS — Z818 Family history of other mental and behavioral disorders: Secondary | ICD-10-CM

## 2017-02-04 DIAGNOSIS — R109 Unspecified abdominal pain: Secondary | ICD-10-CM

## 2017-02-04 DIAGNOSIS — F329 Major depressive disorder, single episode, unspecified: Secondary | ICD-10-CM | POA: Diagnosis present

## 2017-02-04 DIAGNOSIS — R1084 Generalized abdominal pain: Secondary | ICD-10-CM | POA: Diagnosis present

## 2017-02-04 DIAGNOSIS — E8729 Other acidosis: Secondary | ICD-10-CM | POA: Diagnosis present

## 2017-02-04 LAB — COMPREHENSIVE METABOLIC PANEL
ALK PHOS: 84 U/L (ref 38–126)
ALT: 12 U/L — ABNORMAL LOW (ref 17–63)
ANION GAP: 18 — AB (ref 5–15)
AST: 19 U/L (ref 15–41)
Albumin: 4.8 g/dL (ref 3.5–5.0)
BUN: 10 mg/dL (ref 6–20)
CALCIUM: 10 mg/dL (ref 8.9–10.3)
CO2: 14 mmol/L — AB (ref 22–32)
Chloride: 103 mmol/L (ref 101–111)
Creatinine, Ser: 1.33 mg/dL — ABNORMAL HIGH (ref 0.61–1.24)
Glucose, Bld: 90 mg/dL (ref 65–99)
Potassium: 4.7 mmol/L (ref 3.5–5.1)
SODIUM: 135 mmol/L (ref 135–145)
Total Bilirubin: 1.1 mg/dL (ref 0.3–1.2)
Total Protein: 8.6 g/dL — ABNORMAL HIGH (ref 6.5–8.1)

## 2017-02-04 LAB — LIPASE, BLOOD: LIPASE: 24 U/L (ref 11–51)

## 2017-02-04 LAB — I-STAT CG4 LACTIC ACID, ED: LACTIC ACID, VENOUS: 1.45 mmol/L (ref 0.5–1.9)

## 2017-02-04 LAB — CBC
HCT: 47.8 % (ref 39.0–52.0)
HEMOGLOBIN: 16.4 g/dL (ref 13.0–17.0)
MCH: 28.9 pg (ref 26.0–34.0)
MCHC: 34.3 g/dL (ref 30.0–36.0)
MCV: 84.2 fL (ref 78.0–100.0)
PLATELETS: 343 10*3/uL (ref 150–400)
RBC: 5.68 MIL/uL (ref 4.22–5.81)
RDW: 12.9 % (ref 11.5–15.5)
WBC: 6.1 10*3/uL (ref 4.0–10.5)

## 2017-02-04 MED ORDER — IOPAMIDOL (ISOVUE-300) INJECTION 61%
INTRAVENOUS | Status: AC
  Administered 2017-02-05: 100 mL
  Filled 2017-02-04: qty 100

## 2017-02-04 MED ORDER — ONDANSETRON HCL 4 MG/2ML IJ SOLN
4.0000 mg | Freq: Once | INTRAMUSCULAR | Status: AC
  Administered 2017-02-04: 4 mg via INTRAVENOUS
  Filled 2017-02-04: qty 2

## 2017-02-04 MED ORDER — SODIUM CHLORIDE 0.9 % IV BOLUS (SEPSIS)
1000.0000 mL | Freq: Once | INTRAVENOUS | Status: AC
  Administered 2017-02-04: 1000 mL via INTRAVENOUS

## 2017-02-04 MED ORDER — MORPHINE SULFATE (PF) 4 MG/ML IV SOLN
4.0000 mg | Freq: Once | INTRAVENOUS | Status: AC
Start: 2017-02-04 — End: 2017-02-04
  Administered 2017-02-04: 4 mg via INTRAVENOUS
  Filled 2017-02-04: qty 1

## 2017-02-04 NOTE — ED Triage Notes (Signed)
Pt arrives via [facility] for c/o abd cramping x2 days. Pt ambulatory, nad.

## 2017-02-04 NOTE — ED Provider Notes (Signed)
[facility] Provider Note   CSN: [id number] Arrival date & time: 02/04/17  1806     History   Chief Complaint Chief Complaint  Patient presents with  . Abdominal Pain    HPI Randy Fry is a 31 y.o. male.  The history is provided by the patient.  Is that he complains of generalized abdominal pain for the last 2 days.  Pain is dull and crampy with some radiation to the chest.  There is no radiation of the back.  He rates pain a 10/10.  Pain has been getting worse.  He has had nausea and vomiting.  Pain did not improve after vomiting.  He denies bowel movement or passing flatus during this time.  He denies fever, chills, sweats.  Denies urinary urgency, frequency, tenesmus, dysuria.  He has not taken anything for the pain.  He is never had anything like this before.  He is not on any medications, denies ethanol and drug use, denies any prior medical history.  Past Medical History:  Diagnosis Date  . Paranoid schizophrenia [facility](HCC)     Patient Active Problem List   Diagnosis Date Noted  . Paranoid schizophrenia (HCC) 04/28/2016    History reviewed. No pertinent surgical history.     Home Medications    Prior to Admission medications   Medication Sig Start Date End Date Taking? Authorizing Provider  Skin Protectants, Misc. (EUCERIN) cream Apply topically as needed for dry skin. 09/09/16   Roxy HorsemanBrowning, Robert, PA-C    Family History No family history on file.  Social History Social History   Tobacco Use  . Smoking status: Never Smoker  . Smokeless tobacco: Never Used  Substance Use Topics  . Alcohol use: No  . Drug use: No     Allergies   Patient has no known allergies.   Review of Systems Review of Systems  All other systems reviewed and are negative.    Physical Exam Updated Vital Signs BP 113/88 (BP Location: Right Arm)   Pulse (!) 117   Temp 98.2 F (36.8 C) (Oral)   Resp (!) 22   SpO2 98%   Physical Exam    Nursing note and vitals reviewed.  31 year old male, resting comfortably and in no acute distress. Vital signs are significant for tachycardia and tachypnea. Oxygen saturation is 22%, which is normal. Head is normocephalic and atraumatic. PERRLA, EOMI. Oropharynx is clear. Neck is nontender and supple without adenopathy or JVD. Back is nontender and there is no CVA tenderness. Lungs are clear without rales, wheezes, or rhonchi. Chest is nontender. Heart has regular rate and rhythm without murmur. Abdomen is soft, flat, with mild tenderness diffusely.  There is no rebound or guarding.  There are no masses or hepatosplenomegaly and peristalsis is hypoactive. Extremities have no cyanosis or edema, full range of motion is present. Skin is warm and dry without rash. Neurologic: Mental status is normal, cranial nerves are intact, there are no motor or sensory deficits.  ED Treatments / Results  Labs (all labs ordered are listed, but only abnormal results are displayed) Labs Reviewed  COMPREHENSIVE METABOLIC PANEL - Abnormal; Notable for the following components:      Result Value   CO2 14 (*)    Creatinine, Ser 1.33 (*)    Total Protein 8.6 (*)    ALT 12 (*)    Anion gap 18 (*)    All other components within normal limits  BASIC METABOLIC PANEL - Abnormal;  Notable for the following components:   CO2 14 (*)    Anion gap 20 (*)    All other components within normal limits  LIPASE, BLOOD  CBC  URINALYSIS, ROUTINE W REFLEX MICROSCOPIC  I-STAT CG4 LACTIC ACID, ED  I-STAT CG4 LACTIC ACID, ED    EKG  EKG Interpretation  Date/Time:  Friday February 04 2017 18:13:56 EST Ventricular Rate:  133 PR Interval:  122 QRS Duration: 74 QT Interval:  288 QTC Calculation: 428 R Axis:   86 Text Interpretation:  Sinus tachycardia Otherwise normal ECG No old tracing to compare Confirmed by Dione BoozeGlick, Ilayda Toda ([id number]) on 02/04/2017 10:48:39 PM       Radiology No results  found.  Procedures Procedures (including critical care time)  Medications Ordered in ED Medications  sodium chloride 0.9 % bolus 1,000 mL (0 mLs Intravenous Stopped 02/05/17 0157)  ondansetron (ZOFRAN) injection 4 mg (4 mg Intravenous Given 02/04/17 2351)  morphine 4 MG/ML injection 4 mg (4 mg Intravenous Given 02/04/17 2359)  iopamidol (ISOVUE-300) 61 % injection (100 mLs  Contrast Given 02/05/17 0017)     Initial Impression / Assessment and Plan / ED Course  I have reviewed the triage vital signs and the nursing notes.  Pertinent labs & imaging results that were available during my care of the patient were reviewed by me and considered in my medical decision making (see chart for details).  Abdominal pain of uncertain cause.  Old records are reviewed and he has no relevant past visits.  Laboratory workup is significant for modest increase in creatinine over baseline, and high anion gap metabolic acidosis.  Will check lactic acid level and will send for CT of abdomen and pelvis.  Of note, urinalysis is still pending at this time.  In the meantime, he will be given IV fluids, morphine, ondansetron.  CT is unremarkable.  Lactic acid level is normal.  Will recheck electrolytes following IV hydration.  Repeat electrolytes show no change in CO2.  Anion gap is now 20.  Case is discussed with Dr. Loney Lohathore of internal medicine teaching service who agrees to admit the patient.  Final Clinical Impressions(s) / ED Diagnoses   Final diagnoses:  Abdominal pain, unspecified abdominal location  Sinus tachycardia  High anion gap metabolic acidosis    ED Discharge Orders    None       Dione BoozeGlick, Chantell Kunkler, MD 02/05/17 [phone]

## 2017-02-05 ENCOUNTER — Encounter (HOSPITAL_COMMUNITY): Payer: Self-pay | Admitting: Radiology

## 2017-02-05 ENCOUNTER — Emergency Department (HOSPITAL_COMMUNITY): Payer: Self-pay

## 2017-02-05 DIAGNOSIS — E8729 Other acidosis: Secondary | ICD-10-CM | POA: Diagnosis present

## 2017-02-05 DIAGNOSIS — E872 Acidosis: Secondary | ICD-10-CM

## 2017-02-05 DIAGNOSIS — R1084 Generalized abdominal pain: Secondary | ICD-10-CM | POA: Diagnosis present

## 2017-02-05 DIAGNOSIS — K5900 Constipation, unspecified: Secondary | ICD-10-CM

## 2017-02-05 LAB — URINALYSIS, ROUTINE W REFLEX MICROSCOPIC
Bacteria, UA: NONE SEEN
Bilirubin Urine: NEGATIVE
GLUCOSE, UA: NEGATIVE mg/dL
Ketones, ur: 20 mg/dL — AB
Leukocytes, UA: NEGATIVE
Nitrite: NEGATIVE
PROTEIN: NEGATIVE mg/dL
RBC / HPF: NONE SEEN RBC/hpf (ref 0–5)
SPECIFIC GRAVITY, URINE: 1.009 (ref 1.005–1.030)
SQUAMOUS EPITHELIAL / LPF: NONE SEEN
pH: 6 (ref 5.0–8.0)

## 2017-02-05 LAB — BLOOD GAS, ARTERIAL
ACID-BASE DEFICIT: 15 mmol/L — AB (ref 0.0–2.0)
BICARBONATE: 11.2 mmol/L — AB (ref 20.0–28.0)
Drawn by: 311011
FIO2: 0.21
O2 Saturation: 98.2 %
PCO2 ART: 28.6 mmHg — AB (ref 32.0–48.0)
PH ART: 7.219 — AB (ref 7.350–7.450)
PO2 ART: 132 mmHg — AB (ref 83.0–108.0)
Patient temperature: 98.6

## 2017-02-05 LAB — RAPID URINE DRUG SCREEN, HOSP PERFORMED
AMPHETAMINES: NOT DETECTED
BARBITURATES: NOT DETECTED
BENZODIAZEPINES: NOT DETECTED
COCAINE: NOT DETECTED
Opiates: POSITIVE — AB
TETRAHYDROCANNABINOL: NOT DETECTED

## 2017-02-05 LAB — BASIC METABOLIC PANEL
ANION GAP: 20 — AB (ref 5–15)
ANION GAP: 15 (ref 5–15)
BUN: 9 mg/dL (ref 6–20)
BUN: 5 mg/dL — ABNORMAL LOW (ref 6–20)
CALCIUM: 9.8 mg/dL (ref 8.9–10.3)
CHLORIDE: 107 mmol/L (ref 101–111)
CO2: 14 mmol/L — AB (ref 22–32)
CO2: 15 mmol/L — AB (ref 22–32)
CREATININE: 1.23 mg/dL (ref 0.61–1.24)
Calcium: 9 mg/dL (ref 8.9–10.3)
Chloride: 101 mmol/L (ref 101–111)
Creatinine, Ser: 1.12 mg/dL (ref 0.61–1.24)
GFR calc Af Amer: >60 mL/min (ref >60)
GFR calc non Af Amer: >60 mL/min (ref >60)
GLUCOSE: 72 mg/dL (ref 65–99)
GLUCOSE: 80 mg/dL (ref 65–99)
POTASSIUM: 4.6 mmol/L (ref 3.5–5.1)
Potassium: 4.7 mmol/L (ref 3.5–5.1)
Sodium: 135 mmol/L (ref 135–145)
Sodium: 137 mmol/L (ref 135–145)

## 2017-02-05 LAB — ACETAMINOPHEN LEVEL

## 2017-02-05 LAB — SALICYLATE LEVEL: Salicylate Lvl: <7 mg/dL (ref 2.8–30.0)

## 2017-02-05 LAB — ETHANOL: Alcohol, Ethyl (B): <10 mg/dL (ref <10)

## 2017-02-05 LAB — HIV ANTIBODY (ROUTINE TESTING W REFLEX): HIV Screen 4th Generation wRfx: NONREACTIVE

## 2017-02-05 MED ORDER — ENOXAPARIN SODIUM 40 MG/0.4ML ~~LOC~~ SOLN
40.0000 mg | SUBCUTANEOUS | Status: DC
  Administered 2017-02-05 – 2017-02-10 (×6): 40 mg via SUBCUTANEOUS
  Filled 2017-02-05 (×7): qty 0.4

## 2017-02-05 MED ORDER — ONDANSETRON HCL 4 MG/2ML IJ SOLN: 4.0000 mg | Freq: Four times a day (QID) | INTRAMUSCULAR | Status: DC | PRN

## 2017-02-05 MED ORDER — MORPHINE SULFATE (PF) 4 MG/ML IV SOLN: 2.0000 mg | INTRAVENOUS | Status: DC | PRN

## 2017-02-05 MED ORDER — SODIUM CHLORIDE 0.9 % IV SOLN
INTRAVENOUS | Status: AC
  Administered 2017-02-05 (×2): via INTRAVENOUS

## 2017-02-05 MED ORDER — INFLUENZA VAC SPLIT QUAD 0.5 ML IM SUSY
0.5000 mL | PREFILLED_SYRINGE | INTRAMUSCULAR | Status: AC
  Administered 2017-02-07: 0.5 mL via INTRAMUSCULAR
  Filled 2017-02-05: qty 0.5

## 2017-02-05 MED ORDER — SENNOSIDES-DOCUSATE SODIUM 8.6-50 MG PO TABS
1.0000 | ORAL_TABLET | Freq: Two times a day (BID) | ORAL | Status: DC
  Administered 2017-02-05 – 2017-02-09 (×8): 1 via ORAL
  Filled 2017-02-05 (×9): qty 1

## 2017-02-05 MED ORDER — SIMETHICONE 80 MG PO CHEW
80.0000 mg | CHEWABLE_TABLET | Freq: Once | ORAL | Status: AC
  Administered 2017-02-05: 80 mg via ORAL
  Filled 2017-02-05: qty 1

## 2017-02-05 MED ORDER — SODIUM CHLORIDE 0.9 % IV SOLN
INTRAVENOUS | Status: DC
  Administered 2017-02-05: 06:00:00 via INTRAVENOUS

## 2017-02-05 NOTE — Progress Notes (Signed)
Pt states he has not voided all day.  Doing bladder scan to see if any residual in bladder.  Unsure if pt is voiding in BR and not telling us or has really not voided.  Pt does not appear to be in any distress and bladder not distended.

## 2017-02-05 NOTE — ED Notes (Signed)
PT. Returned from CT via stretcher.

## 2017-02-05 NOTE — Progress Notes (Signed)
Notified Internal Medicine Teaching Service that patient has arrived to the unit per physician orders. Notified via text page to [phone].

## 2017-02-05 NOTE — Discharge Summary (Signed)
Name: Randy Fry MRN: [id number] DOB: 1985-08-31 31 y.o. PCP: System, Pcp Not In  Date of Admission: 02/04/2017 10:53 PM Date of Discharge: 02/11/2017 Attending Physician: Sid Falcon, MD  Discharge Diagnosis: 1. Abdominal pain 2. Elevated anion gap metabolic acidosis 3. Recurrent fever  Principal Problem:   Paranoid schizophrenia ([place]) Active Problems:   Diffuse abdominal pain   High anion gap metabolic acidosis   Discharge Medications: Allergies as of 02/11/2017   No Known Allergies     Medication List    TAKE these medications   eucerin cream Apply topically as needed for dry skin. What changed:    how much to take  when to take this       Disposition and follow-up:   Randy Fry was discharged from [facility] in Good condition.  At the hospital follow up visit please address:  1.  Recurrence of fever or abdominal pain?  2.  Labs / imaging needed at time of follow-up: None  3.  Pending labs/ test needing follow-up: None  Follow-up Appointments:   Hospital Course by problem list: Principal Problem:   Paranoid schizophrenia ([place]) Active Problems:   Diffuse abdominal pain   High anion gap metabolic acidosis   1. Abdominal pain Patient presented on 12/8 with abdominal pain associated with N/V. No BM in 2 weeks. Denies sick contacts or recent travel. CT abdomen/pelvis negative for acute pathology but moderate stool burden. Labs negative for etiology. Patient managed with pain medication and anti-emetics with resolution of abdominal pain.  2. High anion gap metabolic acidosis Patient found to have [place] with compensatory respiratory alkalosis, which resolved after 1-2 days. Patient denies alcohol or other illicit drug use. Lactic acid, BG, acetaminophen, salicylates, ethanol wnl. UA and plasma osmolality wnl. UDS positive for opiates, but patient had received morphine while in the hospital. Blood acetone level elevated at  0.021%.  3. Recurrent fever Unclear etiology. No infectious s/s. Possibly drug ingestion vs nonspecific viral gastroenteritis. BCx NGTD. HIV viral load negative. CRP and ESR mildly elevated. Ferritin, RVP, flu, rapid strep, mono, ANA negative. Discharged after afebrile x48h.  4. Hx of schizophrenia Not on medications since March. Previously on thorazine, trazodone, and hydroxyzine per chart review. Patient with flat and blunted affect throughout hospital stay. No SI/HI. Behavioral Health consulted and deemed patient cleared for discharge from psychiatric standpoint and did not require inpatient psychiatric admission. Patient declined outpatient psychiatric services.  Discharge Vitals:   BP 102/61 (BP Location: Right Arm)   Pulse 71   Temp 98.1 F (36.7 C) (Oral)   Resp 18   Ht '5\' 9"'  (1.753 m)   Wt 141 lb 12.1 oz (64.3 kg)   SpO2 98%   BMI 20.93 kg/m   Pertinent Labs, Studies, and Procedures:  CBC Latest Ref Rng & Units 02/10/2017 02/08/2017 02/07/2017  WBC 4.0 - 10.5 K/uL 2.7(L) 3.0(L) 2.4(L)  Hemoglobin 13.0 - 17.0 g/dL 11.5(L) 11.8(L) 10.9(L)  Hematocrit 39.0 - 52.0 % 34.0(L) 35.2(L) 32.4(L)  Platelets 150 - 400 K/uL 189 164 186   CMP Latest Ref Rng & Units 02/07/2017 02/06/2017 02/05/2017  Glucose 65 - 99 mg/dL 104(H) 148(H) 80  BUN 6 - 20 mg/dL <5(L) <5(L) 5(L)  Creatinine 0.61 - 1.24 mg/dL 0.79 0.91 1.12  Sodium 135 - 145 mmol/L 136 136 137  Potassium 3.5 - 5.1 mmol/L 3.8 3.2(L) 4.6  Chloride 101 - 111 mmol/L 106 110 107  CO2 22 - 32 mmol/L 24 19(L) 15(L)  Calcium 8.9 -  10.3 mg/dL 8.0(L) 8.4(L) 9.0  Total Protein 6.5 - 8.1 g/dL - - -  Total Bilirubin 0.3 - 1.2 mg/dL - - -  Alkaline Phos 38 - 126 U/L - - -  AST 15 - 41 U/L - - -  ALT 17 - 63 U/L - - -   ABG 4.069/86.1/483 Salicylates negative Acetaminophen negative Ethanol negative Volatile levels positive for elevated blood acetone levels HIV Ab screen negative Plasma osmolality 282 Lipase 24 Lactic acid 1.45 BCx  NGTD ANA negative Rapid strep negative RVP negative Flu negative CRP 6.2, ESR 18 Ferritin 100 HIV viral load negative  CT Abdomen/Pelvis 02/05/2017 No acute intra-abdominal or pelvic pathology. No bowel obstruction for active inflammation. Normal appendix.  CXR 02/06/2017 No active cardiopulmonary disease.  Discharge Instructions: Discharge Instructions    Call MD for:  difficulty breathing, headache or visual disturbances   Complete by:  As directed    Call MD for:  extreme fatigue   Complete by:  As directed    Call MD for:  persistant dizziness or light-headedness   Complete by:  As directed    Call MD for:  persistant nausea and vomiting   Complete by:  As directed    Call MD for:  severe uncontrolled pain   Complete by:  As directed    Call MD for:  temperature >100.4   Complete by:  As directed    Diet - low sodium heart healthy   Complete by:  As directed    Increase activity slowly   Complete by:  As directed      Signed: [name], MD 02/11/2017, 8:29 AM   Pager: Mamie Nick [phone]

## 2017-02-05 NOTE — ED Notes (Addendum)
Pt. To CT via stretcher. 

## 2017-02-05 NOTE — Progress Notes (Signed)
   Subjective:  [name] was lying in bed comfortably this morning. He states that his abdominal pain is slightly improved. He denies current N/V and states that he has not had any diarrhea. He does report no BM for the last 2 weeks. He denies a history of abdominal surgeries. No chest pain, shortness of breath, fevers, sick contacts, or recent travel. He states that he lives at home alone in a house. He prefers not to disclose what he does for a living, although he states that it is something intermittent and is not anything illegal.  He states that he does not have a PCP and that he is not currently taking any of his medications for schizophrenia, which included thorazine, trazodone, and hydroxyzine. Denies smoking, alcohol, or other illicit drug use. Denies having any family in the area.  Objective:  Vital signs in last 24 hours: Vitals:   02/05/17 0200 02/05/17 0400 02/05/17 0415 02/05/17 0519  BP: 106/70 103/79 108/70 110/71  Pulse: 93  94 (!) 106  Resp:    17  Temp:    99 F (37.2 C)  TempSrc:    Oral  SpO2: 100% 99% 99% 99%  Weight:    132 lb 7.9 oz (60.1 kg)   GEN: Well-appearing, young male lying in bed in NAD. Alert and oriented. HENT: Randy Fry/AT. No visible lesions. EYES: Sclera non-icteric. Conjunctiva clear. RESP: Clear to auscultation bilaterally. No wheezes, rales, or rhonchi. No increased work of breathing. CV: Normal rate and regular rhythm. No murmurs, gallops, or rubs. No LE edema. ABD: Soft. Non-tender to palpation. Non-distended. Decreased bowel sounds. No palpable masses. EXT: No edema. Warm and well perfused. NEURO: Cranial nerves II-XII grossly intact. Able to lift all four extremities against gravity. No apparent audiovisual hallucinations. Speech fluent and appropriate. PSYCH: Patient is calm and pleasant. Flat affect. Well-groomed; speech is appropriate and on-subject.  Assessment/Plan:  Active Problems:   Diffuse abdominal pain   High anion gap metabolic  acidosis  Randy Fry is a 31yo male with PMH of schizophrenia (not currently on any medications) who presents with complaints of abdominal pain. Found to have AGMA in the ED.  Anion Gap Metabolic Acidosis with compensatory respiratory alkalosis No clear etiology at this point. AG now 15 with bicarb 15. Lactic acid normal. BUN wnl, Cr minimally elevated at 1.33. BG wnl. Given psychiatric history, there is concern for ingestion of substances that could cause observed acidosis. Acetaminophen, ethanol, and salicylate levels negative. - F/u volatile levels (acetone, ethanol, isoprop, methanol) - F/u UDS and UA - Check plasma osmolality - Monitor vital signs  Abdominal Pain Improved since admission. No identifiable etiology on CT abdomen. Lipase nl. He reports a long period since his last BM, though stool burden noted on scan not consistent with reported interval. This may be a non-specific viral gastroenteritis in the absence of other identifiable abnormalities. Sinus tachycardia present on admission, now improved. - Continue morphine 2mg  q3h PRN for pain control - Zofran q6h PRN - IVF NS 17925ml/hr  H/o Schizophrenia Pt denies any medical history including mental health history, though schizophrenia is documented, including in an ED visit (04/2016) which states the pt presented from a psychiatric facility. At the time he was noted to be on thorazine, trazodone, hydroxyzine. Patient reports he is currently not on medications since around March. - Continue to monitor  Dispo: Anticipated discharge in approximately 2-3 day(s).   Randy Fry, Randy Czajka, MD 02/05/2017, 11:45 AM Pager: Demetrius CharityP [phone]

## 2017-02-05 NOTE — H&P (Signed)
Date: 02/05/2017               Patient Name:  Randy Fry MRN: [id number]  DOB: 05-25-1985 Age / Sex: 31 y.o., male   PCP: System, Pcp Not In         Medical Service: Internal Medicine Teaching Service         Attending Physician: Dr. Inez CatalinaMullen, Randy B, MD    First Contact: Dr. Renaldo Fry  Pager: [phone]  Second Contact: Dr. Mikey Fry Pager: [phone]       After Hours (After 5p/  First Contact Pager: [phone]  weekends / holidays): Second Contact Pager: [phone]   Chief Complaint: Abdominal Pain  History of Present Illness: [name] is a 31 yo M with a past medical history of schizophrenia who presents with complaints of abdominal pain.   He reports a two day history of diffuse abdominal pain described as an ache which has worsened since onset. Today, he began to have associated emesis with two episodes (green/yellow, no blood). No modifying factors or relation to food intake noted. He has had decreased PO intake but has been able to tolerate fluids. He denies diarrhea and states his last BM was 2 weeks ago or greater. He denies trying new or unusual food/drinks, not currently on medications, no sick contacts, no alcohol use, no fever or weight loss.   In the ED, afebrile, HR 147, BP 103/77, 99% on RA. Labs notable for Cr 1.33, CO2 14, Anion gap 18, LA 1.45, lipase wnl, bg 72. CBC unremarkable. CT Abd/P was negative for acute pathology, moderate stool burden. He received pain and nausea control, 1 L IVF with improvement in tachycardia, Cr. He was admitted for further management.   Meds:  No outpatient medications have been marked as taking for the 02/04/17 encounter [facility](Hospital Encounter).  No medications since around March    Allergies: Allergies as of 02/04/2017  . (No Known Allergies)   Past Medical History:  Diagnosis Date  . Paranoid schizophrenia (HCC)     Family History: Reviewed with pt, no family history that he is aware of.   Social History: Denies tobacco, alcohol,  or other drug use.   Review of Systems: A complete ROS was negative except as per HPI.   Physical Exam: Blood pressure 113/88, pulse (!) 117, temperature 98.2 F (36.8 C), temperature source Oral, resp. rate (!) 22, SpO2 98 %. General: Young male resting in bed comfortably, no acute distress Head: Normocephalic, atraumatic  Eyes: PERRL, normal conjuctiva  ENT: Slightly dry mucus membranes, no exudate CV: Tachycardic, regular rhythm, no murmur appreciated  Resp: Clear breath sounds bilaterally, normal work of breathing, no distress  Abd: +BS, no increased tenderness to palpation throughout abdomen, no rebound, no guarding, mild tightness to lower abdomen Extr: No LE edema Neuro: Alert and oriented x3  Skin: Warm, dry. Normal skin turgor Psych: Flat affect     EKG: personally reviewed my interpretation is sinus tachycardia, intervals wnl, no ischemic changes, no priors available for review   Assessment & Plan by Problem:  Anion Gap Metabolic Acidosis Found to have bicarb 14 with AG 18 on initial labs with no significant change following 1 L bolus. LA wnl at 1.45, bg 72 and no hx of DM. He has a very mild bump in his baseline Cr which has improved, nl BUN- would also not explain acidosis. Given psychiatric history, there is concern for ingestion of substances that could cause observed acidosis. --Monitor vital signs --ABG --  Volatile levels  --Salycylate, acetaminophen  --UDS and U/A  --Rpt BMP    Abdominal Pain Presents with diffuse abdominal pain with associated n/v, no identified pathology on imaging, lipase nl. He reports a long period since his last BM, though stool burden noted on scan not consistent with reported interval. His pain had improved at the time of exam. This may be a non-specific viral gastroenteritis in the absence of other identifiable abnormalities.  --Cont morphine 2 mg q3hr prn for pain control --Zofran q6hr prn  --IVF at 125 cc/hr    H/o Schizophrenia Pt  denies any medical history including mental health history, though schizophrenia is documented, including in an ED visit (04/2016) which states the pt presented from a psychiatric facility. At the time he was noted to be on thorazine, trazadone, vistaril. Reports he is currently not on medications since around March.    Dispo: Admit patient to Observation with expected length of stay less than 2 midnights.  Signed: Ginger Carne, MD 02/05/2017, 4:09 AM  Pager: [phone]

## 2017-02-05 NOTE — Progress Notes (Signed)
Pt voided

## 2017-02-06 ENCOUNTER — Inpatient Hospital Stay (HOSPITAL_COMMUNITY): Payer: Self-pay

## 2017-02-06 DIAGNOSIS — F209 Schizophrenia, unspecified: Secondary | ICD-10-CM

## 2017-02-06 DIAGNOSIS — R509 Fever, unspecified: Secondary | ICD-10-CM

## 2017-02-06 LAB — CBC
HCT: 36.8 % — ABNORMAL LOW (ref 39.0–52.0)
HEMOGLOBIN: 12.5 g/dL — AB (ref 13.0–17.0)
MCH: 27.8 pg (ref 26.0–34.0)
MCHC: 34 g/dL (ref 30.0–36.0)
MCV: 81.8 fL (ref 78.0–100.0)
Platelets: 236 10*3/uL (ref 150–400)
RBC: 4.5 MIL/uL (ref 4.22–5.81)
RDW: 13 % (ref 11.5–15.5)
WBC: 3.6 10*3/uL — ABNORMAL LOW (ref 4.0–10.5)

## 2017-02-06 LAB — OSMOLALITY: Osmolality: 282 mOsm/kg (ref 275–295)

## 2017-02-06 LAB — BASIC METABOLIC PANEL
Anion gap: 7 (ref 5–15)
BUN: <5 mg/dL — ABNORMAL LOW (ref 6–20)
CO2: 19 mmol/L — ABNORMAL LOW (ref 22–32)
Calcium: 8.4 mg/dL — ABNORMAL LOW (ref 8.9–10.3)
Chloride: 110 mmol/L (ref 101–111)
Creatinine, Ser: 0.91 mg/dL (ref 0.61–1.24)
GFR calc Af Amer: >60 mL/min (ref >60)
GFR calc non Af Amer: >60 mL/min (ref >60)
Glucose, Bld: 148 mg/dL — ABNORMAL HIGH (ref 65–99)
Potassium: 3.2 mmol/L — ABNORMAL LOW (ref 3.5–5.1)
Sodium: 136 mmol/L (ref 135–145)

## 2017-02-06 MED ORDER — SODIUM CHLORIDE 0.9 % IV SOLN
INTRAVENOUS | Status: AC
  Administered 2017-02-06 – 2017-02-07 (×4): via INTRAVENOUS

## 2017-02-06 MED ORDER — ACETAMINOPHEN 325 MG PO TABS
650.0000 mg | ORAL_TABLET | Freq: Four times a day (QID) | ORAL | Status: DC | PRN
  Administered 2017-02-06 – 2017-02-10 (×7): 650 mg via ORAL
  Filled 2017-02-06 (×6): qty 2

## 2017-02-06 MED ORDER — POTASSIUM CHLORIDE CRYS ER 20 MEQ PO TBCR
40.0000 meq | EXTENDED_RELEASE_TABLET | Freq: Two times a day (BID) | ORAL | Status: AC
  Administered 2017-02-06 (×2): 40 meq via ORAL
  Filled 2017-02-06 (×3): qty 2

## 2017-02-06 MED ORDER — POLYETHYLENE GLYCOL 3350 17 G PO PACK
17.0000 g | PACK | Freq: Three times a day (TID) | ORAL | Status: DC
  Administered 2017-02-06 – 2017-02-09 (×6): 17 g via ORAL
  Filled 2017-02-06 (×7): qty 1

## 2017-02-06 NOTE — Progress Notes (Signed)
   Subjective:  [name] was lying in bed comfortably this morning. He states that his abdominal pain continues to improve. He states he was able to eat dinner without difficulty last night and is trying to order breakfast.  He denies nausea/vomiting or diarrhea.  He states that he still has not been able to have a bowel movement with current bowel regimen.  He also reports that he had fever/chills overnight but states he feels fine now.    Objective:  Vital signs in last 24 hours: Vitals:   02/05/17 0519 02/05/17 1500 02/05/17 2133 02/06/17 0515  BP: 110/71 112/66 107/63 (!) 115/55  Pulse: (!) 106 (!) 107 99 (!) 106  Resp: 17 15 16 18   Temp: 99 F (37.2 C) 99.3 F (37.4 C) 99.2 F (37.3 C) (!) 101.7 F (38.7 C)  TempSrc: Oral Oral Oral Oral  SpO2: 99% 100% 100% 100%  Weight: 132 lb 7.9 oz (60.1 kg)      GEN: Well-appearing, young male lying in bed in NAD. Alert and oriented. HENT: [place]/AT. No visible lesions. EYES: Sclera non-icteric. Conjunctiva clear. RESP: Clear to auscultation bilaterally. No wheezes, rales, or rhonchi. No increased work of breathing. CV: Normal rate and regular rhythm. No murmurs, gallops, or rubs. No LE edema. ABD: Soft. Non-tender to palpation. Non-distended. bowel sounds present. No palpable masses. EXT: No edema. Warm and well perfused. PSYCH: Patient is calm and pleasant. Flat affect. Well-groomed; speech is appropriate and on-subject.  Assessment/Plan:  Active Problems:   Diffuse abdominal pain   High anion gap metabolic acidosis  Mr. [name] is a 31yo male with PMH of schizophrenia (not currently on any medications) who presents with complaints of abdominal pain. Found to have AGMA in the ED.  Anion Gap Metabolic Acidosis with compensatory respiratory alkalosis No clear etiology at this point.  Lactic acid normal. BUN wnl, Cr minimally elevated at 1.33. BG wnl. Given psychiatric history, there is concern for ingestion of substances that could  cause observed acidosis. Acetaminophen, ethanol, and salicylate levels negative. Plasma osmolality wnl.  Volatile levels pending.  UDS positive for opiates (however has gotten opiates on admission) - F/u volatile levels (acetone, ethanol, isoprop, methanol) - F/u UDS and UA - Monitor vital signs  Abdominal Pain Improved since admission. No identifiable etiology on CT abdomen. Lipase nl. He reports a long period since his last BM. This may be a non-specific viral gastroenteritis in the absence of other identifiable abnormalities.  - Zofran q6h PRN - IVF NS 16100ml/hr - sennakot and miralax  Fever Patient had a spike in temperature overnight to 101.7.  Is in sinus tachycardia to 106.  Unclear etiology.  Will get blood cultures -BC  H/o Schizophrenia Pt denies any medical history including mental health history, though schizophrenia is documented, including in an ED visit (04/2016) which states the pt presented from a psychiatric facility. At the time he was noted to be on thorazine, trazodone, hydroxyzine. Patient reports he is currently not on medications since around March.  No signs of hallucinations or paranoid behavior at this time. - Continue to monitor  Dispo: Anticipated discharge in approximately 2-3 day(s).   Geralyn CorwinHoffman, Jenniger Figiel Saint Dav[place]atl[place], DO 02/06/2017, 10:00 AM Pager: Demetrius CharityP [phone]

## 2017-02-07 ENCOUNTER — Other Ambulatory Visit: Payer: Self-pay

## 2017-02-07 LAB — BASIC METABOLIC PANEL
Anion gap: 6 (ref 5–15)
CHLORIDE: 106 mmol/L (ref 101–111)
CO2: 24 mmol/L (ref 22–32)
CREATININE: 0.79 mg/dL (ref 0.61–1.24)
Calcium: 8 mg/dL — ABNORMAL LOW (ref 8.9–10.3)
GFR calc Af Amer: >60 mL/min (ref >60)
GFR calc non Af Amer: >60 mL/min (ref >60)
GLUCOSE: 104 mg/dL — AB (ref 65–99)
Potassium: 3.8 mmol/L (ref 3.5–5.1)
SODIUM: 136 mmol/L (ref 135–145)

## 2017-02-07 LAB — CBC
HCT: 32.4 % — ABNORMAL LOW (ref 39.0–52.0)
HEMOGLOBIN: 10.9 g/dL — AB (ref 13.0–17.0)
MCH: 27.5 pg (ref 26.0–34.0)
MCHC: 33.6 g/dL (ref 30.0–36.0)
MCV: 81.6 fL (ref 78.0–100.0)
Platelets: 186 10*3/uL (ref 150–400)
RBC: 3.97 MIL/uL — ABNORMAL LOW (ref 4.22–5.81)
RDW: 13 % (ref 11.5–15.5)
WBC: 2.4 10*3/uL — ABNORMAL LOW (ref 4.0–10.5)

## 2017-02-07 LAB — DIFFERENTIAL
Basophils Absolute: 0 10*3/uL (ref 0.0–0.1)
Basophils Relative: 1 %
EOS ABS: 0 10*3/uL (ref 0.0–0.7)
EOS PCT: 1 %
LYMPHS ABS: 0.8 10*3/uL (ref 0.7–4.0)
LYMPHS PCT: 36 %
MONO ABS: 0.3 10*3/uL (ref 0.1–1.0)
MONOS PCT: 14 %
NEUTROS PCT: 48 %
Neutro Abs: 1.1 10*3/uL — ABNORMAL LOW (ref 1.7–7.7)

## 2017-02-07 NOTE — Progress Notes (Signed)
   Subjective:  Lying comfortably in bed this morning. Reports mild abdominal pain. Denies N/V. Had a loose NB bowel movement this morning. Denies cough, chest pain, shortness of breath, or urinary symptoms. Endorses cold chills.  Objective:  Vital signs in last 24 hours: Vitals:   02/06/17 0515 02/06/17 1736 02/06/17 2114 02/07/17 0415  BP: (!) 115/55 121/67 (!) 107/57 (!) 102/59  Pulse: (!) 106 (!) 115 (!) 112 100  Resp: 18   20  Temp: (!) 101.7 F (38.7 C) (!) 101.1 F (38.4 C) 98.1 F (36.7 C) (!) 100.5 F (38.1 C)  TempSrc: Oral Oral Oral Oral  SpO2: 100% 100% 100% 100%  Weight:   132 lb (59.9 kg)    GEN: Well-appearing, young male lying in bed in NAD. Alert and oriented. RESP: Clear to auscultation bilaterally. No wheezes, rales, or rhonchi. No increased work of breathing. CV: Normal rate and regular rhythm. No murmurs, gallops, or rubs. No LE edema. ABD: Soft. Non-tender to palpation. Non-distended. Decreased bowel sounds. No palpable masses. EXT: No edema. Warm and well perfused. NEURO: Cranial nerves II-XII grossly intact. Able to lift all four extremities against gravity. No apparent audiovisual hallucinations. Speech fluent and appropriate. PSYCH: Patient is calm and pleasant. Flat affect. Well-groomed; speech is appropriate and on-subject.  Assessment/Plan:  Active Problems:   Diffuse abdominal pain   High anion gap metabolic acidosis  Mr. Dost is a 31yo male with PMH of schizophrenia (not currently on any medications) who presents with complaints of abdominal pain. Found to have AGMA in the ED, now resolved  Anion Gap Metabolic Acidosis, now resolved No clear etiology at this point. AG now 6 with bicarb 24. Concern for drug ingestion vs nonspecific viral gastroenteritis. - F/u volatile levels (acetone, ethanol, isoprop, methanol) - Monitor vital signs  Fever to 100.5 This morning. Differential infectious vs medication-induced vs nonspecific viral  gastroenteritis. Blood cultures drawn and NGTD. CBC with WBC low at 2.4 and differential with low neutrophils. HIV Ab Screen negative but possible ?early HIV infection. - Continue to monitor fever curve - F/u HIV viral load - If high fever > 101.5, re-culture  Abdominal Pain Improved since admission. No identifiable etiology on CT abdomen. Lipase nl. - Continue morphine 2mg  q3h PRN for pain control - Zofran q6h PRN - IVF NS 16600ml/hr  H/o Schizophrenia Currently not on medications - Continue to monitor  Dispo: Anticipated discharge in approximately 2-3 day(s).   Scherrie GerlachHuang, Kerrie Timm, MD 02/07/2017, 12:25 PM Pager: Demetrius CharityP [phone]

## 2017-02-08 DIAGNOSIS — R51 Headache: Secondary | ICD-10-CM

## 2017-02-08 LAB — RESPIRATORY PANEL BY PCR
ADENOVIRUS-RVPPCR: NOT DETECTED
BORDETELLA PERTUSSIS-RVPCR: NOT DETECTED
CHLAMYDOPHILA PNEUMONIAE-RVPPCR: NOT DETECTED
Coronavirus 229E: NOT DETECTED
Coronavirus HKU1: NOT DETECTED
Coronavirus NL63: NOT DETECTED
Coronavirus OC43: NOT DETECTED
INFLUENZA A-RVPPCR: NOT DETECTED
Influenza B: NOT DETECTED
MYCOPLASMA PNEUMONIAE-RVPPCR: NOT DETECTED
Metapneumovirus: NOT DETECTED
PARAINFLUENZA VIRUS 3-RVPPCR: NOT DETECTED
PARAINFLUENZA VIRUS 4-RVPPCR: NOT DETECTED
Parainfluenza Virus 1: NOT DETECTED
Parainfluenza Virus 2: NOT DETECTED
RHINOVIRUS / ENTEROVIRUS - RVPPCR: NOT DETECTED
Respiratory Syncytial Virus: NOT DETECTED

## 2017-02-08 LAB — FERRITIN: Ferritin: 100 ng/mL (ref 24–336)

## 2017-02-08 LAB — MONONUCLEOSIS SCREEN: MONO SCREEN: NEGATIVE

## 2017-02-08 LAB — CBC
HEMATOCRIT: 35.2 % — AB (ref 39.0–52.0)
HEMOGLOBIN: 11.8 g/dL — AB (ref 13.0–17.0)
MCH: 27.8 pg (ref 26.0–34.0)
MCHC: 33.5 g/dL (ref 30.0–36.0)
MCV: 83 fL (ref 78.0–100.0)
Platelets: 164 10*3/uL (ref 150–400)
RBC: 4.24 MIL/uL (ref 4.22–5.81)
RDW: 13.6 % (ref 11.5–15.5)
WBC: 3 10*3/uL — ABNORMAL LOW (ref 4.0–10.5)

## 2017-02-08 LAB — INFLUENZA PANEL BY PCR (TYPE A & B)
INFLBPCR: NEGATIVE
Influenza A By PCR: NEGATIVE

## 2017-02-08 LAB — RAPID STREP SCREEN (MED CTR MEBANE ONLY): STREPTOCOCCUS, GROUP A SCREEN (DIRECT): NEGATIVE

## 2017-02-08 LAB — C-REACTIVE PROTEIN: CRP: 6.2 mg/dL — AB (ref <1.0)

## 2017-02-08 LAB — SEDIMENTATION RATE: SED RATE: 18 mm/h — AB (ref 0–16)

## 2017-02-08 NOTE — Progress Notes (Signed)
Tech followed up with Pt regarding bath. Pt states that he has already taken care of hygiene. Tech noticed that the bath linen from earlier is still on the counter unused.

## 2017-02-08 NOTE — Progress Notes (Signed)
   Subjective:  Lying comfortably in bed this morning. Endorses feeling very warm and diffuse myalgias. Had a BM yesterday evening. Denies N/V. Reports abdominal pain is improved. Denies joint pains or skin rashes. Has a mild left-sided headache, but no neck pain or meningismus. Denies cough or sore throat. No recent travel overseas.  Objective:  Vital signs in last 24 hours: Vitals:   02/07/17 0415 02/07/17 1418 02/07/17 2121 02/08/17 0524  BP: (!) 102/59 112/62 110/64 (!) 102/55  Pulse: 100 100 99 91  Resp: '20 15 16 16  '$ Temp: (!) 100.5 F (38.1 C) 98.6 F (37 C) 100.3 F (37.9 C) (!) 102.2 F (39 C)  TempSrc: Oral Oral Oral Oral  SpO2: 100% 100% 100% 100%  Weight:       GEN: Well-appearing, young male lying in bed in NAD. Alert and oriented. HENT: No erythema, exudates, or swelling of posterior pharynx NECK: No tenderness to palpation on posterior neck. No meningismus, no tenderness with chin to chest. No tender cervical adenopathy. RESP: Clear to auscultation bilaterally. No wheezes, rales, or rhonchi. No increased work of breathing. CV: Normal rate and regular rhythm. No murmurs, gallops, or rubs. No LE edema. ABD: Soft. Non-tender to palpation. Non-distended. Normal bowel sounds. No palpable masses. EXT: No edema. Well perfused. Skin is very warm on palpation diffusely. NEURO: Cranial nerves II-XII grossly intact. Able to lift all four extremities against gravity. No apparent audiovisual hallucinations. Speech fluent and appropriate. PSYCH: Patient is calm and pleasant. Flat affect. Well-groomed; speech is appropriate and on-subject.  Assessment/Plan:  Active Problems:   Diffuse abdominal pain   High anion gap metabolic acidosis  Randy Fry is a 31yo male with PMH of schizophrenia (not currently on any medications) who presents with complaints of abdominal pain. Found to have Randy Fry in the ED, now resolved  Recurrent daily fever For the last 2 days. Spiked another fever  this morning to 102.2. Blood cultures redrawn, NGTD. Differential includes infectious (EBV vs strep vs flu/other viral infection) vs medication-induced vs rheum (vasculitis vs Still's vs lupus) vs less likely malignancy (given normal CXR and reassuring CT A/P). - Continue to monitor fever curve - F/u BCx - F/u HIV viral load - ESR, ANA, ferritin, monospot, flu, RVP - If high fever > 101.5, re-culture  Anion Gap Metabolic Acidosis, now resolved No clear etiology at this point. - F/u volatile levels (acetone, ethanol, isoprop, methanol) - Monitor vital signs  Abdominal Pain, resolved No identifiable etiology. +BMs - Continue morphine '2mg'$  q3h PRN for pain control - Zofran q6h PRN - IVF NS 12m/hr  H/o Schizophrenia Currently not on medications - Continue to monitor  Dispo: Anticipated discharge in approximately 2-3 day(s).   HColbert Ewing MD 02/08/2017, 7:16 AM Pager: PMamie [phone]

## 2017-02-08 NOTE — Progress Notes (Signed)
Tech offered Pt a bath. Pt stated that he will do bath later. Pt has all oral and bath supplies.

## 2017-02-09 DIAGNOSIS — R824 Acetonuria: Secondary | ICD-10-CM

## 2017-02-09 LAB — VOLATILES,BLD-ACETONE,ETHANOL,ISOPROP,METHANOL
Acetone, blood: 0.021 % — ABNORMAL HIGH (ref 0.000–0.010)
Ethanol, blood: NEGATIVE % (ref 0.000–0.010)
Isopropanol, blood: NEGATIVE % (ref 0.000–0.010)
Methanol, blood: NEGATIVE % (ref 0.000–0.010)

## 2017-02-09 LAB — HIV-1 RNA QUANT-NO REFLEX-BLD
HIV 1 RNA Quant: <20 copies/mL
LOG10 HIV-1 RNA: UNDETERMINED {Log_copies}/mL

## 2017-02-09 LAB — ANA W/REFLEX IF POSITIVE: Anti Nuclear Antibody(ANA): NEGATIVE

## 2017-02-09 MED ORDER — POLYETHYLENE GLYCOL 3350 17 G PO PACK: 17.0000 g | PACK | Freq: Every day | ORAL | Status: DC | PRN

## 2017-02-09 MED ORDER — SENNOSIDES-DOCUSATE SODIUM 8.6-50 MG PO TABS
1.0000 | ORAL_TABLET | Freq: Every evening | ORAL | Status: DC | PRN
  Administered 2017-02-10: 1 via ORAL
  Filled 2017-02-09: qty 1

## 2017-02-09 NOTE — Progress Notes (Signed)
   Subjective:  Lying comfortably in bed this morning. Endorses some hot/cold chills. Denies abdominal pain, N/V. States he is tolerating PO intake. States he was eating okay prior to admission as well.  Objective:  Vital signs in last 24 hours: Vitals:   02/08/17 0524 02/08/17 1323 02/08/17 2206 02/09/17 0452  BP: (!) 102/55 (!) 99/56 122/66 110/64  Pulse: 91 (!) 106 85 62  Resp: '16 20 18 18  '$ Temp: (!) 102.2 F (39 C) 99.5 F (37.5 C) 98.7 F (37.1 C) 98.3 F (36.8 C)  TempSrc: Oral Oral Oral Oral  SpO2: 100% 100% 100% 98%  Weight:       GEN: Well-appearing, young male lying in bed in NAD. Alert and oriented. RESP: Clear to auscultation bilaterally. No wheezes, rales, or rhonchi. No increased work of breathing. CV: Normal rate and regular rhythm. No murmurs, gallops, or rubs. No LE edema. ABD: Soft. Non-tender to palpation. Non-distended. Normal bowel sounds. EXT: No edema. Well perfused. Skin is very warm on palpation diffusely. NEURO: Cranial nerves II-XII grossly intact. Able to lift all four extremities against gravity. No apparent audiovisual hallucinations. Speech fluent and appropriate. PSYCH: Patient is calm and pleasant. Flat, blunted affect. Well-groomed; speech is appropriate and on-subject.  Labs Blood acetone level elevated to 0.021% CRP 6.2 ESR 18 RVP negative Flu negative Rapid strep negative Ferritin 100 Mono negative ANA pending BCx pending HIV viral load pending  Assessment/Plan:  Active Problems:   Diffuse abdominal pain   High anion gap metabolic acidosis  Randy Fry is a 31yo male with PMH of schizophrenia (not currently on any medications) who presents with complaints of abdominal pain and AGMA, now resolved. Noted to have recurrent daily fever during admission as well as elevated blood acetone level.  Recurrent daily fever For the last 2 days. Afebrile now. Differential includes infectious (EBV vs strep vs flu/other viral infection) vs  medication-induced vs rheum (vasculitis vs Still's vs lupus) vs less likely malignancy (given normal CXR and reassuring CT A/P). ESR and CRP mildly elevated. Other labs normal. - Continue to monitor fever curve - F/u BCx - F/u HIV viral load - F/u ANA - If high fever > 101.5, re-culture  Elevated blood acetone level Differential includes starvation ketoacidosis vs isopropyl alcohol ingestion. Patient noted to have ketones in urine on admission and per chart review, was noted to not be eating in the past. Isopropyl alcohol ingestion does not typically cause AGMA and typically presents with elevated osmolar gap, however, it can cause abdominal pain. Fortunately, both the AGMA and abdominal pain have resolved, so we will continue to monitor at this point. - Monitor vital signs - Continue morphine '2mg'$  q3h PRN for pain control - Zofran q6h PRN - IVF NS 141m/hr - Psych consulted as below  H/o Schizophrenia Currently not on medications. Previously on trazodone, thorazine, and hydroxyzine per chart review. Will consult psychiatry to see whether he may benefit from outpatient medication. - Psych consulted; appreciate their recommendations - Psych to evaluate patient on 12/13.  Dispo: Anticipated discharge in approximately 2-3 day(s).  HColbert Ewing MD 02/09/2017, 11:47 AM Pager: PMamie [phone]

## 2017-02-10 DIAGNOSIS — R000 Tachycardia, unspecified: Secondary | ICD-10-CM

## 2017-02-10 DIAGNOSIS — F200 Paranoid schizophrenia: Secondary | ICD-10-CM

## 2017-02-10 DIAGNOSIS — Z818 Family history of other mental and behavioral disorders: Secondary | ICD-10-CM

## 2017-02-10 DIAGNOSIS — R109 Unspecified abdominal pain: Secondary | ICD-10-CM

## 2017-02-10 LAB — CULTURE, GROUP A STREP (THRC)

## 2017-02-10 LAB — CBC
HCT: 34 % — ABNORMAL LOW (ref 39.0–52.0)
Hemoglobin: 11.5 g/dL — ABNORMAL LOW (ref 13.0–17.0)
MCH: 27.8 pg (ref 26.0–34.0)
MCHC: 33.8 g/dL (ref 30.0–36.0)
MCV: 82.3 fL (ref 78.0–100.0)
Platelets: 189 10*3/uL (ref 150–400)
RBC: 4.13 MIL/uL — ABNORMAL LOW (ref 4.22–5.81)
RDW: 13.6 % (ref 11.5–15.5)
WBC: 2.7 10*3/uL — AB (ref 4.0–10.5)

## 2017-02-10 NOTE — Progress Notes (Signed)
   Subjective:  Lying comfortably in bed this morning. Reports abdominal pain is improved, now 1/10 in severity. Denies N/V. Tolerating PO intake. Requests that he see Psychiatry inpatient if possible. Amenable to discharge.  Objective:  Vital signs in last 24 hours: Vitals:   02/09/17 0452 02/09/17 1415 02/09/17 2234 02/10/17 0534  BP: 110/64 108/60 103/62 114/65  Pulse: 62 92 98 95  Resp: '18 18 18 18  '$ Temp: 98.3 F (36.8 C) 98.2 F (36.8 C) 98.5 F (36.9 C) 99 F (37.2 C)  TempSrc: Oral Oral Oral Oral  SpO2: 98% 100% 100% 100%  Weight:  141 lb 12.1 oz (64.3 kg)    Height:  '5\' 9"'$  (1.753 m)     GEN: Well-appearing, young male lying in bed in NAD. Alert and oriented. RESP: Clear to auscultation bilaterally. No wheezes, rales, or rhonchi. No increased work of breathing. CV: Normal rate and regular rhythm. No murmurs, gallops, or rubs. No LE edema. ABD: Soft. Non-tender to palpation. Non-distended. Normal bowel sounds. EXT: No edema. Well perfused. Skin is very warm on palpation diffusely. NEURO: Cranial nerves II-XII grossly intact. Able to lift all four extremities against gravity. No apparent audiovisual hallucinations. Speech fluent and appropriate. PSYCH: Patient is calm and pleasant. Flat, blunted affect. Well-groomed; speech is appropriate and on-subject.  Labs Blood acetone level elevated to 0.021% CRP 6.2 ESR 18 RVP negative Flu negative Rapid strep negative Ferritin 100 Mono negative ANA negative BCx NGTD HIV viral load negative  Assessment/Plan:  Active Problems:   Diffuse abdominal pain   High anion gap metabolic acidosis  Mr. Enright is a 31yo male with PMH of schizophrenia (not currently on any medications) who presents with complaints of abdominal pain and AGMA, now resolved. Noted to have recurrent daily fever during admission as well as elevated blood acetone level.  Recurrent daily fever, resolved for >48h Afebrile for at least 48 hours. Unclear  etiology. ESR and CRP mildly elevated. Other labs normal. - Continue to monitor fever curve - If high fever > 101.5, re-culture - Able to discharge today  Elevated blood acetone level Differential includes starvation ketoacidosis vs isopropyl alcohol ingestion. Patient noted to have ketones in urine on admission and per chart review, was noted to not be eating in the past. Isopropyl alcohol ingestion does not typically cause AGMA and typically presents with elevated osmolar gap, however, it can cause abdominal pain. Fortunately, both the AGMA and abdominal pain have resolved, so we will continue to monitor at this point. - Monitor vital signs - Continue morphine '2mg'$  q3h PRN for pain control - Zofran q6h PRN - IVF NS 155m/hr - Psych consulted as below  H/o Schizophrenia Currently not on medications. Previously on trazodone, thorazine, and hydroxyzine per chart review. Will consult psychiatry to see whether he may benefit from outpatient medication. - Psych consulted; appreciate their recommendations - Psych consult while inpatient if possible - If unable to complete today, will discharge and instruct patient to f/u outpatient at [facility] Anticipated discharge in approximately 0-1 day(s).  HColbert Ewing MD 02/10/2017, 11:32 AM Pager: PMamie [phone]

## 2017-02-10 NOTE — Consult Note (Addendum)
[facility] Face-to-Face Psychiatry Consult   Reason for Consult: Schizophrenia/medication management  Referring Physician:  Dr. Criselda Peaches Patient Identification: Teron Blais MRN:  [id number] Principal Diagnosis: Paranoid schizophrenia [facility]) Diagnosis:   Patient Active Problem List   Diagnosis Date Noted  . Diffuse abdominal pain [R10.84] 02/05/2017  . High anion gap metabolic acidosis [E87.2] 02/05/2017  . Paranoid schizophrenia (HCC) [F20.0] 04/28/2016    Total Time spent with patient: 1 hour  Subjective:   Milfred Heart is a 31 y.o. male patient admitted with abdominal pain and recurrent daily fever of unknown etiology.   HPI:   Per chart review, patient has history of schrizophrenia. He was previously taking Trazodone, thorazine and Atarax but he is not currently taking medications. He was last seen in the [facility] in February for bizarre behavior. He was not taking care of himself or eating and appeared disheveled. He was placed under an IVC by his brother. He was recommended for inpatient psychiatry hospitalization and diagnosed with unspecified psychotic disorder versus MDD with psychotic features. He was hospitalized at [facility]. He was started on Haldol 2 mg BID, Benztropine 0.5 BID for EPS prevention and Remeron 7.5 mg qhs for depression and appetite stimulation.    On interview, Mr. Belton denies problems with his mood although he reports that his life could be better. He wishes that he had more spending cash. 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 He reports a good appetite and no problems with sleep. He denies SI, HI or AVH.  Past Psychiatric History: Schizophrenia  Risk to Self: Is patient at risk for suicide?: No Risk to Others:  None. Denies HI.  Prior Inpatient Therapy:  He was admitted to [facility] in February for bizarre and disorganized behavior.  Prior Outpatient Therapy:   He has been seen at [facility] in the past.   Past Medical History:  Past  Medical History:  Diagnosis Date  . Paranoid schizophrenia (HCC)    History reviewed. No pertinent surgical history. Family History: No family history on file. Family Psychiatric  History: Mother-schizophrenia and depression.  Social History:  Social History   Substance and Sexual Activity  Alcohol Use No     Social History   Substance and Sexual Activity  Drug Use No    Social History   Socioeconomic History  . Marital status: Married    Spouse name: None  . Number of children: None  . Years of education: None  . Highest education level: None  Social Needs  . Financial resource strain: None  . Food insecurity - worry: None  . Food insecurity - inability: None  . Transportation needs - medical: None  . Transportation needs - non-medical: None  Occupational History  . None  Tobacco Use  . Smoking status: Never Smoker  . Smokeless tobacco: Never Used  Substance and Sexual Activity  . Alcohol use: No  . Drug use: No  . Sexual activity: None  Other Topics Concern  . None  Social History Narrative  . None   Additional Social History: He lives at home with his mother. He denies alcohol, illicit substance or tobacco use.     Allergies:  No Known Allergies  Labs:  Results for orders placed or performed during the hospital encounter of 02/04/17 (from the past 48 hour(s))  Influenza panel by PCR (type A & B)     Status: None   Collection Time: 02/08/17  1:31 PM  Result Value Ref Range  Influenza A By PCR NEGATIVE NEGATIVE   Influenza B By PCR NEGATIVE NEGATIVE    Comment: (NOTE) The Xpert Xpress Flu assay is intended as an aid in the diagnosis of  influenza and should not be used as a sole basis for treatment.  This  assay is FDA approved for nasopharyngeal swab specimens only. Nasal  washings and aspirates are unacceptable for Xpert Xpress Flu testing.   Respiratory Panel by PCR     Status: None   Collection Time: 02/08/17  1:31 PM  Result Value Ref Range    Adenovirus NOT DETECTED NOT DETECTED   Coronavirus 229E NOT DETECTED NOT DETECTED   Coronavirus HKU1 NOT DETECTED NOT DETECTED   Coronavirus NL63 NOT DETECTED NOT DETECTED   Coronavirus OC43 NOT DETECTED NOT DETECTED   Metapneumovirus NOT DETECTED NOT DETECTED   Rhinovirus / Enterovirus NOT DETECTED NOT DETECTED   Influenza A NOT DETECTED NOT DETECTED   Influenza B NOT DETECTED NOT DETECTED   Parainfluenza Virus 1 NOT DETECTED NOT DETECTED   Parainfluenza Virus 2 NOT DETECTED NOT DETECTED   Parainfluenza Virus 3 NOT DETECTED NOT DETECTED   Parainfluenza Virus 4 NOT DETECTED NOT DETECTED   Respiratory Syncytial Virus NOT DETECTED NOT DETECTED   Bordetella pertussis NOT DETECTED NOT DETECTED   Chlamydophila pneumoniae NOT DETECTED NOT DETECTED   Mycoplasma pneumoniae NOT DETECTED NOT DETECTED  Rapid Strep Screen (Not at [facility])     Status: None   Collection Time: 02/08/17  1:31 PM  Result Value Ref Range   Streptococcus, Group A Screen (Direct) NEGATIVE NEGATIVE    Comment: (NOTE) A Rapid Antigen test may result negative if the antigen level in the sample is below the detection level of this test. The FDA has not cleared this test as a stand-alone test therefore the rapid antigen negative result has reflexed to a Group A Strep culture.   Culture, group A strep     Status: None   Collection Time: 02/08/17  1:31 PM  Result Value Ref Range   Specimen Description THROAT    Special Requests NONE Reflexed from [id number]    Culture FEW STREPTOCOCCUS,BETA HEMOLYTIC NOT GROUP A    Report Status 02/10/2017 FINAL   C-reactive protein     Status: Abnormal   Collection Time: 02/08/17  2:14 PM  Result Value Ref Range   CRP 6.2 (H) <1.0 mg/dL  CBC     Status: Abnormal   Collection Time: 02/10/17  6:28 AM  Result Value Ref Range   WBC 2.7 (L) 4.0 - 10.5 K/uL   RBC 4.13 (L) 4.22 - 5.81 MIL/uL   Hemoglobin 11.5 (L) 13.0 - 17.0 g/dL   HCT 23.534.0 (L) 57.339.0 - 22.052.0 %   MCV 82.3 78.0 - 100.0 fL   MCH  27.8 26.0 - 34.0 pg   MCHC 33.8 30.0 - 36.0 g/dL   RDW 25.413.6 27.011.5 - 62.315.5 %   Platelets 189 150 - 400 K/uL    Current Facility-Administered Medications  Medication Dose Route Frequency Provider Last Rate Last Dose  . acetaminophen (TYLENOL) tablet 650 mg  650 mg Oral Q6H PRN Beola CordMelvin, Alexander, MD   650 mg at 02/10/17 1034  . enoxaparin (LOVENOX) injection 40 mg  40 mg Subcutaneous Q24H John Giovanniathore, Vasundhra, MD   40 mg at 02/10/17 1030  . ondansetron (ZOFRAN) injection 4 mg  4 mg Intravenous Q6H PRN John Giovanniathore, Vasundhra, MD      . polyethylene glycol (MIRALAX / GLYCOLAX) packet 17 g  17  g Oral Daily PRN Geralyn CorwinHoffman, Jessica Ratliff, DO      . senna-docusate (Senokot-S) tablet 1 tablet  1 tablet Oral QHS PRN Camelia PhenesHoffman, Jessica Ratliff, DO        Musculoskeletal: Strength & Muscle Tone: within normal limits Gait & Station: UTA since patient was lying in bed. Patient leans: N/A  Psychiatric Specialty Exam: Physical Exam  Nursing note and vitals reviewed. Constitutional: He is oriented to person, place, and time. He appears well-developed and well-nourished.  HENT:  Head: Normocephalic and atraumatic.  Neck: Normal range of motion.  Respiratory: Effort normal.  Musculoskeletal: Normal range of motion.  Neurological: He is alert and oriented to person, place, and time.  Skin: No rash noted.  Psychiatric: His speech is normal and behavior is normal. Judgment and thought content normal. Cognition and memory are normal.    Review of Systems  Constitutional: Negative for chills and fever.  HENT: Negative for hearing loss.   Eyes: Negative for blurred vision.  Cardiovascular: Negative for chest pain.  Gastrointestinal: Negative for abdominal pain, constipation, diarrhea, nausea and vomiting.  Neurological: Negative for headaches.  Psychiatric/Behavioral: Negative for depression, hallucinations, substance abuse and suicidal ideas. The patient is not nervous/anxious and does not have insomnia.      Blood pressure 114/65, pulse 95, temperature 99 F (37.2 C), temperature source Oral, resp. rate 18, height 5\' 9"  (1.753 m), weight 64.3 kg (141 lb 12.1 oz), SpO2 100 %.Body mass index is 20.93 kg/m.  General Appearance:  Young, [place] male who has an Best boyunshaved face, afro hair with a pick in his head and is malodorous. NAD.   Eye Contact:  Good  Speech:  Clear and Coherent and Normal Rate  Volume:  Normal  Mood:  "Okay"  Affect:  Blunt  Thought Process:  Goal Directed and Linear  Orientation:  Full (Time, Place, and Person)  Thought Content:  Logical  Suicidal Thoughts:  No  Homicidal Thoughts:  No  Memory:  Immediate;   Good Recent;   Good Remote;   Good  Judgement:  Fair  Insight:  Fair  Psychomotor Activity:  Normal  Concentration:  Concentration: Good and Attention Span: Good  Recall:  Good  Fund of Knowledge:  Good  Language:  Fair  Akathisia:  No  Handed:  Right  AIMS (if indicated):   N/A  Assets:  Housing Social Support  ADL's:  Intact  Cognition:  WNL  Sleep:   Okay   Assessment:  Mikyle Clabaugh is a 31 y.o. male who was admitted with abdominal pain and recurrent daily fever of unknown etiology. He denies any active psychiatric symptoms at this time. He smells malodorous but he is organized in behavior and thought process and does not appear to be responding to internal stimuli. He declines psychiatric services at this time.   Treatment Plan Summary: -Patient is psychiatrically cleared for discharge. Please see assessment for further details.  -Psychiatry will sign off on patient at this time. Please consult psychiatry again as needed.   Disposition: No evidence of imminent risk to self or others at present.   Patient does not meet criteria for psychiatric inpatient admission.  Cherly BeachJacqueline J Kaiyden Simkin, DO 02/10/2017 12:10 PM

## 2017-02-11 LAB — CULTURE, BLOOD (ROUTINE X 2)
CULTURE: NO GROWTH
CULTURE: NO GROWTH
Special Requests: ADEQUATE
Special Requests: ADEQUATE

## 2017-02-11 NOTE — Progress Notes (Signed)
   Subjective:  Seen by psych yesterday who recommended no further intervention needed. Patient declined outpatient psych services. Reports abdominal pain has resolved. Denies N/V and tolerating PO intake. Amenable to discharge.  Objective:  Vital signs in last 24 hours: Vitals:   02/10/17 0534 02/10/17 1301 02/10/17 2143 02/11/17 0536  BP: 114/65 95/60 (!) 110/59 102/61  Pulse: 95 92 93 71  Resp: '18 18 17 18  '$ Temp: 99 F (37.2 C) 98.8 F (37.1 C) 98.9 F (37.2 C) 98.1 F (36.7 C)  TempSrc: Oral Oral Oral Oral  SpO2: 100% 100% 100% 98%  Weight:      Height:       GEN: Well-appearing, young male lying in bed in NAD. Alert and oriented. RESP: Clear to auscultation bilaterally. No wheezes, rales, or rhonchi. No increased work of breathing. CV: Normal rate and regular rhythm. No murmurs, gallops, or rubs. No LE edema. ABD: Soft. Non-tender to palpation. Non-distended. Normal bowel sounds. EXT: No edema. Well perfused. NEURO: Cranial nerves II-XII grossly intact. Able to lift all four extremities against gravity. No apparent audiovisual hallucinations. Speech fluent and appropriate. PSYCH: Patient is calm and pleasant. Flat, blunted affect. Malodorous; speech is appropriate and on-subject.  Assessment/Plan:  Principal Problem:   Paranoid schizophrenia ([place]) Active Problems:   Diffuse abdominal pain   High anion gap metabolic acidosis  Randy Fry is a 31yo male with PMH of schizophrenia (not currently on any medications) who presents with complaints of abdominal pain and AGMA, now resolved. Noted to have recurrent daily fever during admission as well as elevated blood acetone level.  Recurrent daily fever, resolved for >48h Afebrile for at least 48 hours. Unclear etiology. ESR and CRP mildly elevated. Other labs normal. - Continue to monitor fever curve - Discharge today  Elevated blood acetone level Differential includes starvation ketoacidosis vs isopropyl alcohol  ingestion. Patient noted to have ketones in urine on admission and per chart review, was noted to not be eating in the past. Isopropyl alcohol ingestion does not typically cause AGMA and typically presents with elevated osmolar gap, however, it can cause abdominal pain. Fortunately, both the AGMA and abdominal pain have resolved, so we will continue to monitor at this point. - Monitor vital signs - Continue morphine '2mg'$  q3h PRN for pain control - Zofran q6h PRN - IVF NS 160m/hr  H/o Schizophrenia Currently not on medications. Previously on trazodone, thorazine, and hydroxyzine per chart review. Psychiatry consulted and recommending no need for inpatient psychiatric admission. No SI/HI. Patient declined outpatient psychiatric services. - Psych consulted; appreciate their recommendations - Discharge today  Dispo: Anticipated discharge today  HColbert Ewing MD 02/11/2017, 11:05 AM Pager: PMamie [phone]

## 2017-02-11 NOTE — Progress Notes (Signed)
Randy Fry discharged per MD order. Discussed with the patient and all questions fully answered.  VSS, Skin clean, dry and intact without evidence of skin break down, no evidence of skin tears noted.  IV catheter discontinued intact. Site without signs and symptoms of complications. Dressing and pressure applied.  An After Visit Summary was printed and given to the patient. Patient received prescription.  Discharge education completed with patient/family including follow up instructions, medication list, d/c activities limitations if indicated, with other d/c instructions as indicated by MD - patient able to verbalize understanding, all questions fully answered.   Patient instructed to return to ED, call 911, or call MD for any changes in condition.   Patient provided a bus pass by CSW, and D/C via [place].

## 2017-02-11 NOTE — Social Work (Signed)
CSW consulted for pt transportation needs upon discharge.   Pt states he is heading back to his home on [address], he has no support to get back home and there are no contacts listed for pt support, pt unable to afford bus pass.  Pt also inquired about the temperature outside, CSW let pt know it was cool and asked if pt had appropriate clothing. Pt did not have any clothes with more warmth.   CSW provided pt with a bus pass and flannel button up shirts to layer as he discharged.   CSW signing off. Please consult if any additional needs arise.  Randy HutchingIsabel H Cambell Fry, LCSWA [facility] Clinical Social Work [phone]

## 2017-02-12 ENCOUNTER — Other Ambulatory Visit: Payer: Self-pay

## 2017-02-12 ENCOUNTER — Encounter (HOSPITAL_COMMUNITY): Payer: Self-pay

## 2017-02-12 ENCOUNTER — Emergency Department (HOSPITAL_COMMUNITY)
Admission: EM | Admit: 2017-02-12 | Discharge: 2017-02-12 | Disposition: A | Discharge status: Home / Self Care | Payer: Self-pay | Attending: Emergency Medicine | Admitting: Emergency Medicine

## 2017-02-12 DIAGNOSIS — K5900 Constipation, unspecified: Secondary | ICD-10-CM | POA: Insufficient documentation

## 2017-02-12 DIAGNOSIS — R1084 Generalized abdominal pain: Secondary | ICD-10-CM | POA: Insufficient documentation

## 2017-02-12 MED ORDER — POLYETHYLENE GLYCOL 3350 17 G PO PACK: 17.0000 g | PACK | Freq: Two times a day (BID) | ORAL | 0 refills | Status: DC | PRN

## 2017-02-12 NOTE — ED Triage Notes (Signed)
Pt returns today for abd pain.  NAD.

## 2017-02-13 ENCOUNTER — Other Ambulatory Visit: Payer: Self-pay

## 2017-02-13 ENCOUNTER — Emergency Department (HOSPITAL_COMMUNITY)
Admission: EM | Admit: 2017-02-13 | Discharge: 2017-02-13 | Disposition: A | Discharge status: Home / Self Care | Payer: Self-pay | Attending: Emergency Medicine | Admitting: Emergency Medicine

## 2017-02-13 ENCOUNTER — Encounter (HOSPITAL_COMMUNITY): Payer: Self-pay | Admitting: *Deleted

## 2017-02-13 DIAGNOSIS — K5900 Constipation, unspecified: Secondary | ICD-10-CM | POA: Insufficient documentation

## 2017-02-13 DIAGNOSIS — R109 Unspecified abdominal pain: Secondary | ICD-10-CM | POA: Insufficient documentation

## 2017-02-13 DIAGNOSIS — Z09 Encounter for follow-up examination after completed treatment for conditions other than malignant neoplasm: Secondary | ICD-10-CM

## 2017-02-13 LAB — CULTURE, BLOOD (ROUTINE X 2)
CULTURE: NO GROWTH
Culture: NO GROWTH
SPECIAL REQUESTS: ADEQUATE
Special Requests: ADEQUATE

## 2017-02-13 NOTE — Discharge Instructions (Signed)
You were seen here today for follow-up.  There is no further interventions to be done at this time.  Please fill your MiraLAX prescription and take as directed.  You can have this filled at any local pharmacy for pickup over the counter.  Please call and make an appointment with the wellness center.  The wellness center center sees patients even without insurance.  There is also a phone number provided further in the handout that has a number you can call in order to find a PCP if you do not have one.  You  can return at any time the emergency department if you have worsening or new concerning symptoms.

## 2017-02-13 NOTE — ED Provider Notes (Signed)
[facility] Provider Note   CSN: [id number] Arrival date & time: 02/13/17  1430     History   Chief Complaint Chief Complaint  Patient presents with  . Abdominal Pain  . Follow-up    HPI Randy Fry is a 31 y.o. male with a history of paranoid schizophrenia who presents the ED today for follow-up.  Patient was admitted on 02/04/2017 and discharged on 02/11/2017.  During his stay he was noted to have abdominal pain with associated nausea and vomiting and constipation (2 weeks).  CT abdomen and pelvis that was negative with reassuring labs.  His abdominal pain resolved with antiemetics and he was prescribed MiraLAX.  Patient was seen here yesterday for follow-up with no complaints.  He was discharged with MiraLAX.  He is not filled this.  He is presenting today for follow-up again, because he believes this is what he was told to do.  He states he has no complaints at this time.  He denies any fever, chills, chest pain, shortness of breath, abdominal pain, nausea, vomiting, diarrhea, hematochezia, melena, or urinary symptoms.  His last bowel movement was yesterday and normal.  He does not currently have a PCP and is not sure what this is.   HPI  Past Medical History:  Diagnosis Date  . Paranoid schizophrenia [facility](HCC)     Patient Active Problem List   Diagnosis Date Noted  . Diffuse abdominal pain 02/05/2017  . High anion gap metabolic acidosis 02/05/2017  . Paranoid schizophrenia (HCC) 04/28/2016    History reviewed. No pertinent surgical history.     Home Medications    Prior to Admission medications   Medication Sig Start Date End Date Taking? Authorizing Provider  polyethylene glycol (MIRALAX / GLYCOLAX) packet Take 17 g by mouth 2 (two) times daily as needed. Patient not taking: Reported on 02/13/2017 02/12/17   Raeford RazorKohut, Stephen, MD    Family History History reviewed. No pertinent family history.  Social History Social History     Tobacco Use  . Smoking status: Never Smoker  . Smokeless tobacco: Never Used  Substance Use Topics  . Alcohol use: No  . Drug use: No     Allergies   Patient has no known allergies.   Review of Systems Review of Systems  All other systems reviewed and are negative.    Physical Exam Updated Vital Signs BP (!) 115/56 (BP Location: Right Arm)   Pulse 81   Temp 98.9 F (37.2 C) (Oral)   Resp 14   Ht 5\' 8"  (1.727 m)   Wt 64 kg (141 lb)   SpO2 100%   BMI 21.44 kg/m   Physical Exam  Constitutional: He appears well-developed and well-nourished.  HENT:  Head: Normocephalic and atraumatic.  Right Ear: External ear normal.  Left Ear: External ear normal.  Nose: Nose normal.  Mouth/Throat: Uvula is midline, oropharynx is clear and moist and mucous membranes are normal. No tonsillar exudate.  Eyes: Pupils are equal, round, and reactive to light. Right eye exhibits no discharge. Left eye exhibits no discharge. No scleral icterus.  Neck: Trachea normal. Neck supple. No spinous process tenderness present. No neck rigidity. Normal range of motion present.  Cardiovascular: Normal rate, regular rhythm and intact distal pulses.  No murmur heard. Pulses:      Radial pulses are 2+ on the right side, and 2+ on the left side.       Dorsalis pedis pulses are 2+ on the right side, and  2+ on the left side.       Posterior tibial pulses are 2+ on the right side, and 2+ on the left side.  No lower extremity swelling or edema. Calves symmetric in size bilaterally.  Pulmonary/Chest: Effort normal and breath sounds normal. He exhibits no tenderness.  Abdominal: Soft. Bowel sounds are normal. He exhibits no distension. There is no tenderness. There is no rigidity, no rebound, no guarding and no CVA tenderness.  Musculoskeletal: He exhibits no edema.  Lymphadenopathy:    He has no cervical adenopathy.  Neurological: He is alert.  Skin: Skin is warm and dry. No rash noted. He is not  diaphoretic.  Psychiatric: He has a normal mood and affect.  Nursing note and vitals reviewed.    ED Treatments / Results  Labs (all labs ordered are listed, but only abnormal results are displayed) Labs Reviewed - No data to display  EKG  EKG Interpretation None       Radiology No results found.  Procedures Procedures (including critical care time)  Medications Ordered in ED Medications - No data to display   Initial Impression / Assessment and Plan / ED Course  I have reviewed the triage vital signs and the nursing notes.  Pertinent labs & imaging results that were available during my care of the patient were reviewed by me and considered in my medical decision making (see chart for details).     31 year old male presenting for follow-up.  He is in no acute distress.  He has no current symptoms at this time.  Vital signs are reassuring without fever, tachycardia, tachypnea, hypoxia or hypotension.  His exam is reassuring.  Heart regular rate and rhythm without murmur.  Lungs are clear to auscultation bilaterally.  Abdomen soft, nondistended, non-tender.  No peritoneal signs.  Patient had normal bowel movement yesterday.  He is without fever, nausea, vomiting, diarrhea or any other symptoms.  He was given MiraLAX prescription which she has not filled yet.  I advised the patient to establish care with a PCP so he can follow-up on an outpatient basis.  He is to fill his prescription at pharmacy.  He states understanding and is in agreement with plan.  Final Clinical Impressions(s) / ED Diagnoses   Final diagnoses:  Follow up    ED Discharge Orders    None       Princella PellegriniMaczis, Jeptha Hinnenkamp M, PA-C 02/13/17 1644    Mesner, Barbara CowerJason, MD 02/14/17 [phone]

## 2017-02-13 NOTE — ED Triage Notes (Signed)
Pt reports being here for a follow up on his abd pain. Was seen here on 12/7 and 12/15 for same. No acute distress and no specific complaints.

## 2017-02-28 NOTE — ED Provider Notes (Signed)
[facility] Provider Note   CSN: [id number] Arrival date & time: 02/12/17  1953     History   Chief Complaint Chief Complaint  Patient presents with  . Abdominal Pain    HPI Randy Fry is a 31 y.o. male.  HPI   31 year old male presenting with constipation.  He was admitted 12/7-12/14. During this hospitalization he had nausea vomiting, abdominal pain and some constipation.  He had imaging including CT the abdomen pelvis which was reassuring.  He is presenting back again today because he still has not had a bowel movement.  He has not tried taking anything.  He really has no pain at this point.  Past Medical History:  Diagnosis Date  . Paranoid schizophrenia [facility](HCC)     Patient Active Problem List   Diagnosis Date Noted  . Diffuse abdominal pain 02/05/2017  . High anion gap metabolic acidosis 02/05/2017  . Paranoid schizophrenia (HCC) 04/28/2016    History reviewed. No pertinent surgical history.     Home Medications    Prior to Admission medications   Medication Sig Start Date End Date Taking? Authorizing Provider  polyethylene glycol (MIRALAX / GLYCOLAX) packet Take 17 g by mouth 2 (two) times daily as needed. Patient not taking: Reported on 02/13/2017 02/12/17   Raeford RazorKohut, Perl Folmar, MD    Family History History reviewed. No pertinent family history.  Social History Social History   Tobacco Use  . Smoking status: Never Smoker  . Smokeless tobacco: Never Used  Substance Use Topics  . Alcohol use: No  . Drug use: No     Allergies   Patient has no known allergies.   Review of Systems Review of Systems  All systems reviewed and negative, other than as noted in HPI.  Physical Exam Updated Vital Signs BP 121/69 (BP Location: Left Arm)   Pulse 82   Temp 98.6 F (37 C) (Oral)   Resp 16   SpO2 100%   Physical Exam  Constitutional: He appears well-developed and well-nourished. No distress.  HENT:  Head:  Normocephalic and atraumatic.  Eyes: Conjunctivae are normal. Right eye exhibits no discharge. Left eye exhibits no discharge.  Neck: Neck supple.  Cardiovascular: Normal rate, regular rhythm and normal heart sounds. Exam reveals no gallop and no friction rub.  No murmur heard. Pulmonary/Chest: Effort normal and breath sounds normal. No respiratory distress.  Abdominal: Soft. He exhibits no distension. There is no tenderness.  Musculoskeletal: He exhibits no edema or tenderness.  Neurological: He is alert.  Skin: Skin is warm and dry.  Psychiatric: Thought content normal.  Odd affect  Nursing note and vitals reviewed.    ED Treatments / Results  Labs (all labs ordered are listed, but only abnormal results are displayed) Labs Reviewed - No data to display  EKG  EKG Interpretation None       Radiology No results found.  Procedures Procedures (including critical care time)  Medications Ordered in ED Medications - No data to display   Initial Impression / Assessment and Plan / ED Course  I have reviewed the triage vital signs and the nursing notes.  Pertinent labs & imaging results that were available during my care of the patient were reviewed by me and considered in my medical decision making (see chart for details).     31 year old male with complaint of constipation.  He really has no complaints otherwise.  Abdominal exam is benign.  Put him on MiraLAX.  Return precautions discussed.  Final Clinical Impressions(s) / ED Diagnoses   Final diagnoses:  Generalized abdominal pain    ED Discharge Orders        Ordered    polyethylene glycol (MIRALAX / GLYCOLAX) packet  2 times daily PRN     02/12/17 2244       Raeford RazorKohut, Suren Payne, MD 02/28/17 [phone]

## 2017-03-31 NOTE — Progress Notes (Signed)
I have looked at this one before.  I cannot change it b/c the person listed is Dr. Renaldo ReelHuang.  She is not able to change it either.  We will need help from someone in the billing department.

## 2017-05-25 ENCOUNTER — Encounter (HOSPITAL_COMMUNITY): Payer: Self-pay | Admitting: Emergency Medicine

## 2017-05-25 ENCOUNTER — Emergency Department (HOSPITAL_COMMUNITY): Payer: Self-pay

## 2017-05-25 ENCOUNTER — Other Ambulatory Visit: Payer: Self-pay

## 2017-05-25 DIAGNOSIS — K5900 Constipation, unspecified: Secondary | ICD-10-CM | POA: Insufficient documentation

## 2017-05-25 LAB — COMPREHENSIVE METABOLIC PANEL
ALK PHOS: 72 U/L (ref 38–126)
ALT: 16 U/L — AB (ref 17–63)
ANION GAP: 9 (ref 5–15)
AST: 18 U/L (ref 15–41)
Albumin: 4.2 g/dL (ref 3.5–5.0)
BILIRUBIN TOTAL: 0.4 mg/dL (ref 0.3–1.2)
BUN: 12 mg/dL (ref 6–20)
CALCIUM: 9.1 mg/dL (ref 8.9–10.3)
CO2: 25 mmol/L (ref 22–32)
CREATININE: 0.79 mg/dL (ref 0.61–1.24)
Chloride: 105 mmol/L (ref 101–111)
GFR calc non Af Amer: >60 mL/min (ref >60)
Glucose, Bld: 101 mg/dL — ABNORMAL HIGH (ref 65–99)
Potassium: 4 mmol/L (ref 3.5–5.1)
Sodium: 139 mmol/L (ref 135–145)
TOTAL PROTEIN: 7.3 g/dL (ref 6.5–8.1)

## 2017-05-25 LAB — CBC
HCT: 40.4 % (ref 39.0–52.0)
HEMOGLOBIN: 13.3 g/dL (ref 13.0–17.0)
MCH: 27.7 pg (ref 26.0–34.0)
MCHC: 32.9 g/dL (ref 30.0–36.0)
MCV: 84 fL (ref 78.0–100.0)
PLATELETS: 257 10*3/uL (ref 150–400)
RBC: 4.81 MIL/uL (ref 4.22–5.81)
RDW: 13.7 % (ref 11.5–15.5)
WBC: 4 10*3/uL (ref 4.0–10.5)

## 2017-05-25 LAB — URINALYSIS, ROUTINE W REFLEX MICROSCOPIC
Bacteria, UA: NONE SEEN
Bilirubin Urine: NEGATIVE
GLUCOSE, UA: NEGATIVE mg/dL
HGB URINE DIPSTICK: NEGATIVE
Ketones, ur: NEGATIVE mg/dL
NITRITE: NEGATIVE
PH: 5 (ref 5.0–8.0)
PROTEIN: NEGATIVE mg/dL
SPECIFIC GRAVITY, URINE: 1.028 (ref 1.005–1.030)

## 2017-05-25 LAB — LIPASE, BLOOD: Lipase: 41 U/L (ref 11–51)

## 2017-05-25 NOTE — ED Triage Notes (Signed)
Pt reports constipation X2 mo.... Pt states he has been seen here for same, given Miralax that has not helped.

## 2017-05-26 ENCOUNTER — Emergency Department (HOSPITAL_COMMUNITY)
Admission: EM | Admit: 2017-05-26 | Discharge: 2017-05-26 | Disposition: A | Discharge status: Home / Self Care | Payer: Self-pay | Attending: Emergency Medicine | Admitting: Emergency Medicine

## 2017-05-26 DIAGNOSIS — K5900 Constipation, unspecified: Secondary | ICD-10-CM

## 2017-05-26 MED ORDER — DOCUSATE SODIUM 100 MG PO CAPS: 100.0000 mg | ORAL_CAPSULE | Freq: Two times a day (BID) | ORAL | 0 refills | Status: DC

## 2017-05-26 MED ORDER — GLYCERIN (ADULT) 2 G RE SUPP: 1.0000 | RECTAL | 0 refills | Status: DC | PRN

## 2017-05-26 MED ORDER — POLYETHYLENE GLYCOL 3350 17 GM/SCOOP PO POWD: 17.0000 g | Freq: Two times a day (BID) | ORAL | 0 refills | Status: DC

## 2017-05-26 NOTE — Discharge Instructions (Signed)
Please read and follow all provided instructions.  Your diagnoses today include:  1. Constipation, unspecified constipation type    Tests performed today include:  Blood counts and electrolytes - no problems  X-ray of your abdomen that shows a large amount of stool in your abdomen  Vital signs. See below for your results today.   Medications prescribed:   Miralax - laxative  This medication can be found over-the-counter.    Colace - stool softener  This medication can be found over-the-counter.   Continue colace for 2 weeks after your stools return to normal to prevent constipation.   Take any medications only as directed on prescription or on packaging.   Home care instructions:  Follow any educational materials contained in this packet.  Follow-up instructions: Please follow-up with your primary care provider in the next week for further evaluation of your symptoms.   Return instructions:   Please return to the Emergency Department if you experience worsening symptoms.   Please return if you have worsening abdominal pain, swelling of your abdomen, persistent vomiting, blood in your stool or vomit, or fever.   Please return if you have any other emergent concerns.  Additional Information:  Your vital signs today were: BP 116/71 (BP Location: Left Arm)    Pulse 88    Temp 98.3 F (36.8 C) (Oral)    Resp 14    Ht 5\' 5"  (1.651 m)    Wt 63.5 kg (140 lb)    SpO2 100%    BMI 23.30 kg/m  If your blood pressure (BP) was elevated above 135/85 this visit, please have this repeated by your doctor within one month. --------------

## 2017-05-26 NOTE — ED Provider Notes (Signed)
[facility] EMERGENCY DEPARTMENT Provider Note   CSN: [id number] Arrival date & time: 05/25/17  2159     History   Chief Complaint Chief Complaint  Patient presents with  . Constipation    HPI Randy Fry is a 32 y.o. male.  Patient with history of paranoid schizophrenia, constipation --presents with complaint of bloating and constipation symptoms over the past several days.  Symptoms began gradually and have worsened.  He has no associated nausea or vomiting.  No fevers or diarrhea.  No urinary symptoms.  No blood noted in the stool.  He has not tried any medications.  He states that he is drinking plenty of fluid.  The course is constant. Aggravating factors: none. Alleviating factors: none.       Past Medical History:  Diagnosis Date  . Paranoid schizophrenia [facility])     Patient Active Problem List   Diagnosis Date Noted  . Diffuse abdominal pain 02/05/2017  . High anion gap metabolic acidosis 02/05/2017  . Paranoid schizophrenia (HCC) 04/28/2016    History reviewed. No pertinent surgical history.      Home Medications    Prior to Admission medications   Medication Sig Start Date End Date Taking? Authorizing Provider  docusate sodium (COLACE) 100 MG capsule Take 1 capsule (100 mg total) by mouth every 12 (twelve) hours. 05/26/17   Renne Crigler, PA-C  glycerin adult 2 g suppository Place 1 suppository rectally as needed for constipation. 05/26/17   Renne Crigler, PA-C  polyethylene glycol powder (GLYCOLAX/MIRALAX) powder Take 17 g by mouth 2 (two) times daily. 05/26/17   Renne Crigler, PA-C    Family History No family history on file.  Social History Social History   Tobacco Use  . Smoking status: Never Smoker  . Smokeless tobacco: Never Used  Substance Use Topics  . Alcohol use: No  . Drug use: No     Allergies   Patient has no known allergies.   Review of Systems Review of Systems  Constitutional: Negative for fever.    HENT: Negative for rhinorrhea and sore throat.   Eyes: Negative for redness.  Respiratory: Negative for cough.   Cardiovascular: Negative for chest pain.  Gastrointestinal: Positive for abdominal pain and constipation. Negative for diarrhea, nausea, rectal pain and vomiting.  Genitourinary: Negative for dysuria.  Musculoskeletal: Negative for myalgias.  Skin: Negative for rash.  Neurological: Negative for headaches.     Physical Exam Updated Vital Signs BP 116/71 (BP Location: Left Arm)   Pulse 88   Temp 98.3 F (36.8 C) (Oral)   Resp 14   Ht 5\' 5"  (1.651 m)   Wt 63.5 kg (140 lb)   SpO2 100%   BMI 23.30 kg/m   Physical Exam  Constitutional: He appears well-developed and well-nourished.  HENT:  Head: Normocephalic and atraumatic.  Mouth/Throat: Oropharynx is clear and moist.  Eyes: Conjunctivae are normal. Right eye exhibits no discharge. Left eye exhibits no discharge.  Neck: Normal range of motion. Neck supple.  Cardiovascular: Normal rate, regular rhythm and normal heart sounds.  No murmur heard. Pulmonary/Chest: Effort normal and breath sounds normal. No stridor. No respiratory distress. He has no wheezes.  Abdominal: Soft. There is no tenderness. There is no rebound and no guarding.  Neurological: He is alert.  Skin: Skin is warm and dry.  Psychiatric: He has a normal mood and affect.  Nursing note and vitals reviewed.    ED Treatments / Results  Labs (all labs ordered are listed, but  only abnormal results are displayed) Labs Reviewed  COMPREHENSIVE METABOLIC PANEL - Abnormal; Notable for the following components:      Result Value   Glucose, Bld 101 (*)    ALT 16 (*)    All other components within normal limits  URINALYSIS, ROUTINE W REFLEX MICROSCOPIC - Abnormal; Notable for the following components:   APPearance HAZY (*)    Leukocytes, UA TRACE (*)    Squamous Epithelial / LPF 0-5 (*)    All other components within normal limits  LIPASE, BLOOD  CBC     EKG None  Radiology Dg Abdomen 1 View  Result Date: 05/25/2017 CLINICAL DATA:  Constipation for 2 months EXAM: ABDOMEN - 1 VIEW COMPARISON:  CT 02/05/2017 FINDINGS: Nonobstructed gas pattern with large amount of stool in the colon. No abnormal calcifications. IMPRESSION: Nonobstructed gas pattern with large amount of stool in the colon Electronically Signed   By: Jasmine PangKim  Fujinaga M.D.   On: 05/25/2017 23:04    Procedures Procedures (including critical care time)  Medications Ordered in ED Medications - No data to display   Initial Impression / Assessment and Plan / ED Course  I have reviewed the triage vital signs and the nursing notes.  Pertinent labs & imaging results that were available during my care of the patient were reviewed by me and considered in my medical decision making (see chart for details).     Patient seen and examined. Work-up reviewed.   Vital signs reviewed and are as follows: BP 116/71 (BP Location: Left Arm)   Pulse 88   Temp 98.3 F (36.8 C) (Oral)   Resp 14   Ht 5\' 5"  (1.651 m)   Wt 63.5 kg (140 lb)   SpO2 100%   BMI 23.30 kg/m   7:48 AM X-ray reviewed by myself. Radiologist report reviewed. Constipation seen, no other acute process.   Symptoms and exam consistent with constipation.  Will start patient on MiraLAX, Colace, glycerin suppositories.  The patient was urged to return to the Emergency Department immediately with worsening of current symptoms, worsening abdominal pain, persistent vomiting, blood noted in stools, fever, or any other concerns. The patient verbalized understanding.     Final Clinical Impressions(s) / ED Diagnoses   Final diagnoses:  Constipation, unspecified constipation type   Patient with history of constipation he was complaining of constipation.  He reports minimal pain.  No tenderness on exam.  His exam is reassuring.  Labs are reassuring.  X-ray demonstrates large amount of stool in the bowel.  No previous  surgeries and no other signs of obstruction.  Patient has not yet tried any treatments for constipation.  Regimen as above.  Return instructions as above.  Normal vitals.  ED Discharge Orders        Ordered    polyethylene glycol powder (GLYCOLAX/MIRALAX) powder  2 times daily     05/26/17 0743    docusate sodium (COLACE) 100 MG capsule  Every 12 hours     05/26/17 0743    glycerin adult 2 g suppository  As needed     05/26/17 0743       Renne CriglerGeiple, Shikita Vaillancourt, PA-C 05/26/17 [id number]    Tegeler, Canary Brimhristopher J, MD 05/26/17 [phone]

## 2017-05-26 NOTE — ED Notes (Signed)
Pt given turkey sandwich and sprite.  

## 2017-06-17 ENCOUNTER — Emergency Department (HOSPITAL_COMMUNITY): Payer: Self-pay

## 2017-06-17 ENCOUNTER — Emergency Department (HOSPITAL_COMMUNITY)
Admission: EM | Admit: 2017-06-17 | Discharge: 2017-06-17 | Disposition: A | Discharge status: Home / Self Care | Payer: Self-pay | Attending: Emergency Medicine | Admitting: Emergency Medicine

## 2017-06-17 ENCOUNTER — Other Ambulatory Visit: Payer: Self-pay

## 2017-06-17 ENCOUNTER — Encounter (HOSPITAL_COMMUNITY): Payer: Self-pay | Admitting: Emergency Medicine

## 2017-06-17 DIAGNOSIS — K5909 Other constipation: Secondary | ICD-10-CM | POA: Insufficient documentation

## 2017-06-17 MED ORDER — DOCUSATE SODIUM 100 MG PO CAPS: 100.0000 mg | ORAL_CAPSULE | Freq: Two times a day (BID) | ORAL | 0 refills | Status: DC

## 2017-06-17 MED ORDER — PEG 3350-KCL-NABCB-NACL-NASULF 236 G PO SOLR: 4000.0000 mL | Freq: Once | ORAL | 0 refills | Status: AC

## 2017-06-17 MED ORDER — BISACODYL 10 MG RE SUPP
10.0000 mg | RECTAL | 0 refills | Status: DC | PRN
Start: 2017-06-17 — End: 2017-07-03

## 2017-06-17 NOTE — Discharge Instructions (Addendum)
Return here as needed. Follow up with a primary doctor. °

## 2017-06-17 NOTE — ED Triage Notes (Signed)
Pt requesting refill on medication he was prescribed for his stomach. Denies nausea/vomiting/diarrhea/abdominal pain.

## 2017-06-17 NOTE — ED Provider Notes (Addendum)
[facility]ERGENC[facility] Provider Note   CSN: [id number] Arrival date & time: 06/17/17  0006     History   Chief Complaint Chief Complaint  Patient presents with  . Medication Refill    HPI Randy Fry is a 32 y.o. male.  HPI Patient presents to the emergency department with request for medications for constipation that he has received previously.  The patient does not have any other requests at this time.  The patient states that he has chronic constipation and those medications were working for him.  The patient denies chest pain, shortness of breath, headache,blurred vision, neck pain, fever, cough, weakness, numbness, dizziness, anorexia, edema, abdominal pain, nausea, vomiting, diarrhea, rash, back pain, dysuria, hematemesis, bloody stool, near syncope, or syncope. Past Medical History:  Diagnosis Date  . Paranoid schizophrenia [facility](HCC)     Patient Active Problem List   Diagnosis Date Noted  . Diffuse abdominal pain 02/05/2017  . High anion gap metabolic acidosis 02/05/2017  . Paranoid schizophrenia (HCC) 04/28/2016    History reviewed. No pertinent surgical history.      Home Medications    Prior to Admission medications   Medication Sig Start Date End Date Taking? Authorizing Provider  docusate sodium (COLACE) 100 MG capsule Take 1 capsule (100 mg total) by mouth every 12 (twelve) hours. 05/26/17   Renne CriglerGeiple, Joshua, PA-C  glycerin adult 2 g suppository Place 1 suppository rectally as needed for constipation. 05/26/17   Renne CriglerGeiple, Joshua, PA-C  polyethylene glycol powder (GLYCOLAX/MIRALAX) powder Take 17 g by mouth 2 (two) times daily. 05/26/17   Renne CriglerGeiple, Joshua, PA-C    Family History No family history on file.  Social History Social History   Tobacco Use  . Smoking status: Never Smoker  . Smokeless tobacco: Never Used  Substance Use Topics  . Alcohol use: No  . Drug use: No     Allergies   Patient has no known  allergies.   Review of Systems Review of Systems All other systems negative except as documented in the HPI. All pertinent positives and negatives as reviewed in the HPI.   Physical Exam Updated Vital Signs BP 109/65 (BP Location: Right Arm)   Pulse 62   Temp 98 F (36.7 C) (Oral)   Resp 18   SpO2 100%   Physical Exam  Constitutional: He is oriented to person, place, and time. He appears well-developed and well-nourished. No distress.  HENT:  Head: Normocephalic and atraumatic.  Mouth/Throat: Oropharynx is clear and moist.  Eyes: Pupils are equal, round, and reactive to light.  Neck: Normal range of motion. Neck supple.  Cardiovascular: Normal rate, regular rhythm and normal heart sounds. Exam reveals no gallop and no friction rub.  No murmur heard. Pulmonary/Chest: Effort normal and breath sounds normal. No respiratory distress. He has no wheezes.  Abdominal: Soft. Bowel sounds are normal. He exhibits no distension. There is no tenderness. There is no rebound and no guarding.  Neurological: He is alert and oriented to person, place, and time. He exhibits normal muscle tone. Coordination normal.  Skin: Skin is warm and dry. Capillary refill takes less than 2 seconds. No rash noted. No erythema.  Psychiatric: He has a normal mood and affect. His behavior is normal.  Nursing note and vitals reviewed.    ED Treatments / Results  Labs (all labs ordered are listed, but only abnormal results are displayed) Labs Reviewed - No data to display  EKG None  Radiology Dg Abd Acute W/chest  Result Date: 06/17/2017 CLINICAL DATA:  Constipation. EXAM: DG ABDOMEN ACUTE W/ 1V CHEST COMPARISON:  Abdominal x-ray dated May 25, 2017. Moderate colonic stool burden. FINDINGS: There is no evidence of dilated bowel loops or free intraperitoneal air. No radiopaque calculi or other significant radiographic abnormality is seen. Heart size and mediastinal contours are within normal limits. Both  lungs are clear. No acute osseous abnormality. IMPRESSION: 1. Moderate colonic stool burden.  No obstruction. 2.  No active cardiopulmonary disease. Electronically Signed   By: Obie Dredge M.D.   On: 06/17/2017 08:32    Procedures Procedures (including critical care time)  Medications Ordered in ED Medications - No data to display   Initial Impression / Assessment and Plan / ED Course  I have reviewed the triage vital signs and the nursing notes.  Pertinent labs & imaging results that were available during my care of the patient were reviewed by me and considered in my medical decision making (see chart for details).     Patient will be started on a bowel prep and Dulcolax suppositories.  Patient is advised he will need to follow-up with the primary doctor told return here as needed. Final Clinical Impressions(s) / ED Diagnoses   Final diagnoses:  None    ED Discharge Orders    None       Charlestine Night, PA-C 06/17/17 1203    Rolan Bucco, MD 06/17/17 1432    Dalaya Suppa, [place], PA-C 06/30/17 1120    Rolan Bucco, MD 07/01/17 [phone]

## 2017-06-30 ENCOUNTER — Emergency Department (HOSPITAL_COMMUNITY)
Admission: EM | Admit: 2017-06-30 | Discharge: 2017-06-30 | Disposition: A | Discharge status: Home / Self Care | Payer: Medicaid Other | Attending: Emergency Medicine | Admitting: Emergency Medicine

## 2017-06-30 ENCOUNTER — Encounter (HOSPITAL_COMMUNITY): Payer: Self-pay

## 2017-06-30 DIAGNOSIS — Z79899 Other long term (current) drug therapy: Secondary | ICD-10-CM | POA: Insufficient documentation

## 2017-06-30 DIAGNOSIS — J111 Influenza due to unidentified influenza virus with other respiratory manifestations: Secondary | ICD-10-CM | POA: Insufficient documentation

## 2017-06-30 DIAGNOSIS — R6889 Other general symptoms and signs: Secondary | ICD-10-CM

## 2017-06-30 MED ORDER — FLUTICASONE PROPIONATE 50 MCG/ACT NA SUSP: 2.0000 | Freq: Every day | NASAL | 0 refills | Status: DC

## 2017-06-30 MED ORDER — BENZONATATE 100 MG PO CAPS: 100.0000 mg | ORAL_CAPSULE | Freq: Three times a day (TID) | ORAL | 0 refills | Status: DC

## 2017-06-30 NOTE — Discharge Instructions (Addendum)
This is likely a viral illness.  No indication for antibiotics.  Motrin and Tylenol for pains.  Tessalon for cough.  Have given you Flonase for nasal congestion.  You can receive flu shots at surrounding pharmacies or [facility].  [facility] also offers flu shots.

## 2017-06-30 NOTE — ED Notes (Signed)
Patient verbalizes understanding of discharge instructions. Opportunity for questioning and answers were provided. Armband removed by staff, pt discharged from ED.  

## 2017-06-30 NOTE — ED Provider Notes (Signed)
[facility] EMERGENCY DEPARTMENT Provider Note   CSN: [id number] Arrival date & time: 06/30/17  0206     History   Chief Complaint Chief Complaint  Patient presents with  . Influenza    HPI Randy Fry is a 32 y.o. male.  HPI 32 year old male past medical history significant for paranoid schizophrenia presents to the emergency department today requesting flu vaccine.  Patient states that he has nasal congestion, sore throat, otalgia and mild cough.  States that he wants a flu shot.  Patient denies any associated fevers or chills.  He has not taken anything for his symptoms prior to arrival.  Nothing makes better or worse.  Denies associated nausea, vomiting or diarrhea.  Denies any pain or shortness of breath.  No known sick contacts. Past Medical History:  Diagnosis Date  . Paranoid schizophrenia [facility])     Patient Active Problem List   Diagnosis Date Noted  . Diffuse abdominal pain 02/05/2017  . High anion gap metabolic acidosis 02/05/2017  . Paranoid schizophrenia (HCC) 04/28/2016    History reviewed. No pertinent surgical history.      Home Medications    Prior to Admission medications   Medication Sig Start Date End Date Taking? Authorizing Provider  benzonatate (TESSALON) 100 MG capsule Take 1 capsule (100 mg total) by mouth every 8 (eight) hours. 06/30/17   Rise Mu, PA-C  bisacodyl (BISAC-EVAC) 10 MG suppository Place 1 suppository (10 mg total) rectally as needed for moderate constipation. 06/17/17   Lawyer, Cristal Deer, PA-C  docusate sodium (COLACE) 100 MG capsule Take 1 capsule (100 mg total) by mouth every 12 (twelve) hours. 06/17/17   Lawyer, Cristal Deer, PA-C  fluticasone (FLONASE) 50 MCG/ACT nasal spray Place 2 sprays into both nostrils daily. 06/30/17   Demetrios Loll T, PA-C  glycerin adult 2 g suppository Place 1 suppository rectally as needed for constipation. 05/26/17   Renne Crigler, PA-C  polyethylene glycol powder  (GLYCOLAX/MIRALAX) powder Take 17 g by mouth 2 (two) times daily. 05/26/17   Renne Crigler, PA-C    Family History No family history on file.  Social History Social History   Tobacco Use  . Smoking status: Never Smoker  . Smokeless tobacco: Never Used  Substance Use Topics  . Alcohol use: No  . Drug use: No     Allergies   Patient has no known allergies.   Review of Systems Review of Systems  All other systems reviewed and are negative.    Physical Exam Updated Vital Signs BP 128/74   Pulse 62   Temp 99.4 F (37.4 C)   Resp 16   SpO2 100%   Physical Exam  Constitutional: He is oriented to person, place, and time. He appears well-developed and well-nourished. No distress.  HENT:  Head: Normocephalic and atraumatic.  Right Ear: External ear normal.  Left Ear: External ear normal.  Nose: Nose normal.  Mouth/Throat: Oropharynx is clear and moist. No oropharyngeal exudate.  Eyes: Conjunctivae are normal. Right eye exhibits no discharge. Left eye exhibits no discharge. No scleral icterus.  Neck: Normal range of motion. Neck supple.  Cardiovascular: Normal rate, regular rhythm, normal heart sounds and intact distal pulses. Exam reveals no gallop and no friction rub.  No murmur heard. Pulmonary/Chest: Effort normal and breath sounds normal. No stridor. No respiratory distress. He has no wheezes. He has no rales. He exhibits no tenderness.  Musculoskeletal: Normal range of motion.  Neurological: He is alert and oriented to person, place, and time.  Skin: Skin is warm and dry. Capillary refill takes less than 2 seconds. No pallor.  Psychiatric: His behavior is normal. Judgment and thought content normal.  Nursing note and vitals reviewed.    ED Treatments / Results  Labs (all labs ordered are listed, but only abnormal results are displayed) Labs Reviewed - No data to display  EKG None  Radiology No results found.  Procedures Procedures (including critical  care time)  Medications Ordered in ED Medications - No data to display   Initial Impression / Assessment and Plan / ED Course  I have reviewed the triage vital signs and the nursing notes.  Pertinent labs & imaging results that were available during my care of the patient were reviewed by me and considered in my medical decision making (see chart for details).     Patient presents to the ED with complaints of URI symptoms.  Is requesting a flu vaccine.  Instructed patient that we do not provide flu vaccine in the ED.  Patient is afebrile.  Exam reassuring.  Lungs clear to auscultation bilaterally.  No indication for chest x-ray.  Doubt pneumonia.  No signs or indications for antibiotics at this time.  Will provide patient with Flonase and Tessalon.  Encourage PCP.  Pt is hemodynamically stable, in NAD, & able to ambulate in the ED. Evaluation does not show pathology that would require ongoing emergent intervention or inpatient treatment. I explained the diagnosis to the patient. Pain has been managed & has no complaints prior to dc. Pt is comfortable with above plan and is stable for discharge at this time. All questions were answered prior to disposition. Strict return precautions for f/u to the ED were discussed. Encouraged follow up with PCP.   Final Clinical Impressions(s) / ED Diagnoses   Final diagnoses:  Flu-like symptoms    ED Discharge Orders        Ordered    fluticasone (FLONASE) 50 MCG/ACT nasal spray  Daily     06/30/17 0524    benzonatate (TESSALON) 100 MG capsule  Every 8 hours     06/30/17 0524       Rise Mu, PA-C 06/30/17 0719    Ward, Layla Maw, DO 06/30/17 2313

## 2017-06-30 NOTE — ED Triage Notes (Signed)
Pt states that he wants a flu vaccine and that he has been having congestion, sore throat, stomach ache for the past 7 days. Denies n/v/d

## 2017-07-03 ENCOUNTER — Emergency Department (HOSPITAL_BASED_OUTPATIENT_CLINIC_OR_DEPARTMENT_OTHER): Payer: Self-pay

## 2017-07-03 ENCOUNTER — Other Ambulatory Visit: Payer: Self-pay

## 2017-07-03 ENCOUNTER — Emergency Department (HOSPITAL_COMMUNITY)
Admission: EM | Admit: 2017-07-03 | Discharge: 2017-07-03 | Disposition: A | Discharge status: Home / Self Care | Payer: Self-pay | Attending: Emergency Medicine | Admitting: Emergency Medicine

## 2017-07-03 ENCOUNTER — Encounter (HOSPITAL_BASED_OUTPATIENT_CLINIC_OR_DEPARTMENT_OTHER): Payer: Self-pay | Admitting: Emergency Medicine

## 2017-07-03 ENCOUNTER — Encounter (HOSPITAL_COMMUNITY): Payer: Self-pay | Admitting: Emergency Medicine

## 2017-07-03 ENCOUNTER — Emergency Department (HOSPITAL_BASED_OUTPATIENT_CLINIC_OR_DEPARTMENT_OTHER)
Admission: EM | Admit: 2017-07-03 | Discharge: 2017-07-04 | Disposition: A | Discharge status: Home / Self Care | Payer: Self-pay | Attending: Emergency Medicine | Admitting: Emergency Medicine

## 2017-07-03 DIAGNOSIS — K5900 Constipation, unspecified: Secondary | ICD-10-CM | POA: Insufficient documentation

## 2017-07-03 DIAGNOSIS — Z79899 Other long term (current) drug therapy: Secondary | ICD-10-CM | POA: Insufficient documentation

## 2017-07-03 DIAGNOSIS — R1084 Generalized abdominal pain: Secondary | ICD-10-CM | POA: Insufficient documentation

## 2017-07-03 DIAGNOSIS — F200 Paranoid schizophrenia: Secondary | ICD-10-CM | POA: Insufficient documentation

## 2017-07-03 LAB — CBC
HCT: 39.8 % (ref 39.0–52.0)
Hemoglobin: 12.9 g/dL — ABNORMAL LOW (ref 13.0–17.0)
MCH: 27.4 pg (ref 26.0–34.0)
MCHC: 32.4 g/dL (ref 30.0–36.0)
MCV: 84.7 fL (ref 78.0–100.0)
Platelets: 315 10*3/uL (ref 150–400)
RBC: 4.7 MIL/uL (ref 4.22–5.81)
RDW: 13.8 % (ref 11.5–15.5)
WBC: 5 10*3/uL (ref 4.0–10.5)

## 2017-07-03 LAB — COMPREHENSIVE METABOLIC PANEL
ALT: 15 U/L — AB (ref 17–63)
AST: 16 U/L (ref 15–41)
Albumin: 4.3 g/dL (ref 3.5–5.0)
Alkaline Phosphatase: 73 U/L (ref 38–126)
Anion gap: 10 (ref 5–15)
BUN: 12 mg/dL (ref 6–20)
CALCIUM: 9.2 mg/dL (ref 8.9–10.3)
CO2: 25 mmol/L (ref 22–32)
CREATININE: 0.91 mg/dL (ref 0.61–1.24)
Chloride: 107 mmol/L (ref 101–111)
Glucose, Bld: 96 mg/dL (ref 65–99)
Potassium: 4.2 mmol/L (ref 3.5–5.1)
Sodium: 142 mmol/L (ref 135–145)
Total Bilirubin: 0.7 mg/dL (ref 0.3–1.2)
Total Protein: 7.2 g/dL (ref 6.5–8.1)

## 2017-07-03 LAB — LIPASE, BLOOD: Lipase: 25 U/L (ref 11–51)

## 2017-07-03 MED ORDER — DOCUSATE SODIUM 100 MG PO CAPS: 100.0000 mg | ORAL_CAPSULE | Freq: Two times a day (BID) | ORAL | 0 refills | Status: DC | PRN

## 2017-07-03 MED ORDER — DOCUSATE SODIUM 100 MG PO CAPS: 100.0000 mg | ORAL_CAPSULE | Freq: Every day | ORAL | 0 refills | Status: DC | PRN

## 2017-07-03 MED ORDER — IOPAMIDOL (ISOVUE-300) INJECTION 61%
100.0000 mL | Freq: Once | INTRAVENOUS | Status: AC | PRN
  Administered 2017-07-03: 100 mL via INTRAVENOUS

## 2017-07-03 MED ORDER — SODIUM CHLORIDE 0.9 % IV BOLUS
1000.0000 mL | Freq: Once | INTRAVENOUS | Status: AC
  Administered 2017-07-03: 1000 mL via INTRAVENOUS

## 2017-07-03 MED ORDER — ALUM & MAG HYDROXIDE-SIMETH 200-200-20 MG/5ML PO SUSP
30.0000 mL | Freq: Once | ORAL | Status: AC
  Administered 2017-07-03: 30 mL via ORAL
  Filled 2017-07-03: qty 30

## 2017-07-03 MED ORDER — POLYETHYLENE GLYCOL 3350 17 GM/SCOOP PO POWD: 17.0000 g | Freq: Every day | ORAL | 0 refills | Status: DC | PRN

## 2017-07-03 MED ORDER — POLYETHYLENE GLYCOL 3350 17 GM/SCOOP PO POWD: 17.0000 g | Freq: Two times a day (BID) | ORAL | 0 refills | Status: DC

## 2017-07-03 NOTE — ED Provider Notes (Signed)
[facility] EMERGENCY DEPARTMENT Provider Note   CSN: [id number] Arrival date & time: 07/03/17  1127     History   Chief Complaint Chief Complaint  Patient presents with  . Abdominal Pain    HPI Randy Fry is a 32 y.o. male.  He presents complaining of constipation.  He calls it congestion of his abdomen.  He is had multiple visits for same.  He is a very poor historian likely secondary to psychiatric disorder.  It looks like he is on a lot of laxatives.  It is unclear he is been taking them because when I asked the patient he says he just takes Maalox.  The history is provided by the patient.  Abdominal Pain   This is a recurrent problem. The current episode started yesterday. The problem has been gradually improving. The pain is associated with an unknown factor. The pain is located in the generalized abdominal region. The pain is moderate. Pertinent negatives include fever, nausea, vomiting and dysuria. Nothing aggravates the symptoms. Nothing relieves the symptoms.    Past Medical History:  Diagnosis Date  . Paranoid schizophrenia [facility])     Patient Active Problem List   Diagnosis Date Noted  . Diffuse abdominal pain 02/05/2017  . High anion gap metabolic acidosis 02/05/2017  . Paranoid schizophrenia (HCC) 04/28/2016    History reviewed. No pertinent surgical history.      Home Medications    Prior to Admission medications   Medication Sig Start Date End Date Taking? Authorizing Provider  benzonatate (TESSALON) 100 MG capsule Take 1 capsule (100 mg total) by mouth every 8 (eight) hours. 06/30/17   Rise Mu, PA-C  bisacodyl (BISAC-EVAC) 10 MG suppository Place 1 suppository (10 mg total) rectally as needed for moderate constipation. 06/17/17   Lawyer, Cristal Deer, PA-C  docusate sodium (COLACE) 100 MG capsule Take 1 capsule (100 mg total) by mouth every 12 (twelve) hours. 06/17/17   Lawyer, Cristal Deer, PA-C  fluticasone (FLONASE) 50  MCG/ACT nasal spray Place 2 sprays into both nostrils daily. 06/30/17   Demetrios Loll T, PA-C  glycerin adult 2 g suppository Place 1 suppository rectally as needed for constipation. 05/26/17   Renne Crigler, PA-C  polyethylene glycol powder (GLYCOLAX/MIRALAX) powder Take 17 g by mouth 2 (two) times daily. 05/26/17   Renne Crigler, PA-C    Family History No family history on file.  Social History Social History   Tobacco Use  . Smoking status: Never Smoker  . Smokeless tobacco: Never Used  Substance Use Topics  . Alcohol use: No  . Drug use: No     Allergies   Patient has no known allergies.   Review of Systems Review of Systems  Constitutional: Negative for fever.  HENT: Negative for sore throat.   Eyes: Negative for visual disturbance.  Respiratory: Negative for shortness of breath.   Cardiovascular: Negative for chest pain.  Gastrointestinal: Positive for abdominal pain. Negative for nausea and vomiting.  Genitourinary: Negative for dysuria.  Skin: Negative for rash.     Physical Exam Updated Vital Signs BP 108/70 (BP Location: Right Arm)   Pulse 94   Temp 98.7 F (37.1 C) (Oral)   Resp 16   SpO2 98%   Physical Exam  Constitutional: He appears well-developed and well-nourished.  HENT:  Head: Normocephalic and atraumatic.  Eyes: Conjunctivae are normal.  Neck: Neck supple.  Cardiovascular: Normal rate and regular rhythm.  No murmur heard. Pulmonary/Chest: Effort normal and breath sounds normal. No respiratory distress.  Abdominal: Soft. Bowel sounds are normal. He exhibits no mass. There is no tenderness. There is no rigidity and no guarding.  Musculoskeletal: He exhibits no edema.  Neurological: He is alert. He has normal strength. No cranial nerve deficit or sensory deficit. Gait normal. GCS eye subscore is 4. GCS verbal subscore is 5. GCS motor subscore is 6.  Skin: Skin is warm and dry.  Psychiatric: He has a normal mood and affect.  Nursing note  and vitals reviewed.    ED Treatments / Results  Labs (all labs ordered are listed, but only abnormal results are displayed) Labs Reviewed  COMPREHENSIVE METABOLIC PANEL - Abnormal; Notable for the following components:      Result Value   ALT 15 (*)    All other components within normal limits  CBC - Abnormal; Notable for the following components:   Hemoglobin 12.9 (*)    All other components within normal limits  LIPASE, BLOOD  URINALYSIS, ROUTINE W REFLEX MICROSCOPIC    EKG None  Radiology No results found.  Procedures Procedures (including critical care time)  Medications Ordered in ED Medications - No data to display   Initial Impression / Assessment and Plan / ED Course  I have reviewed the triage vital signs and the nursing notes.  Pertinent labs & imaging results that were available during my care of the patient were reviewed by me and considered in my medical decision making (see chart for details).  Clinical Course as of Jul 04 813  Wynelle Link Jul 03, 2017  1422 Third attempt to go see the patient over the last 20 minutes.  He still is in the bathroom.   [MB]  1513 reViewed patient's labs with him.  He is asking for a dose of Maalox before he leaves.   [MB]    Clinical Course User Index [MB] Terrilee Files, MD     Final Clinical Impressions(s) / ED Diagnoses   Final diagnoses:  Generalized abdominal pain  Constipation, unspecified constipation type    ED Discharge Orders    None       Terrilee Files, MD 07/04/17 [phone]

## 2017-07-03 NOTE — ED Provider Notes (Signed)
[facility] EMERGENCY DEPARTMENT Provider Note   CSN: [id number] Arrival date & time: 07/03/17  1825     History   Chief Complaint Chief Complaint  Patient presents with  . Constipation    HPI Randy Fry is a 32 y.o. male with a past medical history of paranoid schizophrenia who presents today for evaluation of constipation.  He was seen at [facility] emergency room this morning for constipation.  Patient reports to me that [facility] did not have any IV fluids so they sent him over here for IV fluids and admission.  Patient reports that he is he does not know when his last bowel movement was, however reports that it was small little stones.  He denies any diarrhea.  He is concerned that he has not yet had a bowel movement today.    When I asked the patient what he thinks would make his belly feel better he reports food and something to drink.    Patient reports that his belly hurts in the middle lower area.  Chart review does not show any previous abdominal surgeries.  HPI  Past Medical History:  Diagnosis Date  . Paranoid schizophrenia [facility])     Patient Active Problem List   Diagnosis Date Noted  . Diffuse abdominal pain 02/05/2017  . High anion gap metabolic acidosis 02/05/2017  . Paranoid schizophrenia (HCC) 04/28/2016    History reviewed. No pertinent surgical history.      Home Medications    Prior to Admission medications   Medication Sig Start Date End Date Taking? Authorizing Provider  docusate sodium (COLACE) 100 MG capsule Take 1 capsule (100 mg total) by mouth 2 (two) times daily as needed for mild constipation. 07/03/17   Terrilee Files, MD  polyethylene glycol powder (GLYCOLAX/MIRALAX) powder Take 17 g by mouth daily as needed for moderate constipation. 07/03/17   Terrilee Files, MD    Family History No family history on file.  Social History Social History   Tobacco Use  . Smoking status: Never Smoker  . Smokeless tobacco: Never  Used  Substance Use Topics  . Alcohol use: No  . Drug use: No     Allergies   Patient has no known allergies.   Review of Systems Review of Systems  Constitutional: Negative for chills and fever.  Respiratory: Negative for chest tightness and shortness of breath.   Gastrointestinal: Positive for abdominal pain and constipation. Negative for blood in stool, nausea and vomiting.  All other systems reviewed and are negative.    Physical Exam Updated Vital Signs BP 115/72 (BP Location: Left Arm)   Pulse 71   Temp 97.6 F (36.4 C) (Oral)   Resp 17   SpO2 98%   Physical Exam  Constitutional: He appears well-developed and well-nourished.  Patient appears disheveled, is sitting with a torn green plastic Poncho over his head.  HENT:  Head: Normocephalic and atraumatic.  Eyes: Conjunctivae are normal. Right eye exhibits no discharge. Left eye exhibits no discharge. No scleral icterus.  Neck: Normal range of motion.  Cardiovascular: Normal rate and regular rhythm.  Pulmonary/Chest: Effort normal. No stridor. No respiratory distress.  Abdominal: Soft. Normal appearance. He exhibits no distension. Bowel sounds are increased. There is tenderness in the right lower quadrant, periumbilical area, suprapubic area, left upper quadrant and left lower quadrant.  Musculoskeletal: He exhibits no edema or deformity.  Neurological: He is alert. He exhibits normal muscle tone.  Skin: Skin is warm and dry. He  is not diaphoretic.  Psychiatric:  Odd affect  Nursing note and vitals reviewed.    ED Treatments / Results  Labs (all labs ordered are listed, but only abnormal results are displayed) Labs Reviewed  URINALYSIS, ROUTINE W REFLEX MICROSCOPIC    EKG None  Radiology Ct Abdomen Pelvis W Contrast  Result Date: 07/03/2017 CLINICAL DATA:  No bowel movements today.  Constipation. EXAM: CT ABDOMEN AND PELVIS WITH CONTRAST TECHNIQUE: Multidetector CT imaging of the abdomen and pelvis was  performed using the standard protocol following bolus administration of intravenous contrast. CONTRAST:  ISOVUE-300 IOPAMIDOL (ISOVUE-300) INJECTION 61% COMPARISON:  None. FINDINGS: LOWER CHEST: Lung bases are clear. Included heart size is normal. No pericardial effusion. HEPATOBILIARY: Liver and gallbladder are normal. PANCREAS: Normal. SPLEEN: Normal. ADRENALS/URINARY TRACT: Kidneys are orthotopic, demonstrating symmetric enhancement. No nephrolithiasis, hydronephrosis or solid renal masses. Urinary bladder is partially distended and unremarkable. Normal adrenal glands. STOMACH/BOWEL: The stomach, small and large bowel are normal in course and caliber without inflammatory changes. Increased fecal retention within the colon consistent constipation. Normal appendix. VASCULAR/LYMPHATIC: Aortoiliac vessels are normal in course and caliber. No lymphadenopathy by CT size criteria. REPRODUCTIVE: Normal. OTHER: No intraperitoneal free fluid or free air. MUSCULOSKELETAL: Nonacute. IMPRESSION: Increased fecal retention within large bowel consistent with constipation. No bowel obstruction or inflammation. Electronically Signed   By: Tollie Eth M.D.   On: 07/03/2017 23:31    Procedures Procedures (including critical care time)  Medications Ordered in ED Medications  sodium chloride 0.9 % bolus 1,000 mL (1,000 mLs Intravenous New Bag/Given 07/03/17 2149)  iopamidol (ISOVUE-300) 61 % injection 100 mL (100 mLs Intravenous Contrast Given 07/03/17 2225)     Initial Impression / Assessment and Plan / ED Course  I have reviewed the triage vital signs and the nursing notes.  Pertinent labs & imaging results that were available during my care of the patient were reviewed by me and considered in my medical decision making (see chart for details).    He presents today for evaluation of constipation.  He was seen earlier this morning at [facility].  He was instructed to start taking MiraLAX, states that he has not  started this yet.  On my exam he has generalized abdominal tenderness to palpation.  Patient is a very poor historian, most likely secondary to his psychiatric conditions.  Chart review shows that he has a history of constipation, however he generally does not have abdominal tenderness when he presents for constipation.  Chart review shows that he has not had a recent CT scan.  Based on his tenderness to palpation CT scan was obtained.  He was given fluid bolus, after which he reports that he felt better.  He was given p.o. challenge which he passed without difficulty.  Instructed that he needs to actually get the MiraLAX and start taking it.  Informed him about the [facility] and need to follow-up with the nurse practitioner there.  I have provided him with this information and a referral.  I told him that they can help him obtain medications such as MiraLAX.  Instructed him to start a high-fiber diet.    At this time patient does not have any evidence of an acute life-threatening condition.  He will be discharged home.  Final Clinical Impressions(s) / ED Diagnoses   Final diagnoses:  Constipation, unspecified constipation type    ED Discharge Orders    None       Cristina Gong, [place] 07/04/17 0024  Marily Memos, MD 07/04/17 [phone]

## 2017-07-03 NOTE — Discharge Instructions (Signed)
Please start eating a high fiber diet.  Please start taking miralax 1-2 times a day.  This will help your constipation resolve over time.  You may not immediately have a bowel movement and it can take 2-3 days.  Please make sure you are drinking enough water.

## 2017-07-03 NOTE — Discharge Instructions (Signed)
Your evaluated in the emergency department for abdominal pain constipation.  We are prescribing you some medication to help with this.  Your lab work was unremarkable and your exam was also normal.  Please return if any worsening symptoms.

## 2017-07-03 NOTE — ED Triage Notes (Addendum)
Pt states he needs fluids and hasn't been able to have a BM today. Pt was seen [facility] today and given miralax and had labs drawn.

## 2017-07-03 NOTE — ED Notes (Signed)
Patient transported to CT 

## 2017-07-03 NOTE — ED Triage Notes (Signed)
Pt states he has congestion, abd pain and sore throat that began this am. Pt poor historian.

## 2017-07-04 LAB — URINALYSIS, ROUTINE W REFLEX MICROSCOPIC
BILIRUBIN URINE: NEGATIVE
Glucose, UA: NEGATIVE mg/dL
HGB URINE DIPSTICK: NEGATIVE
Ketones, ur: 40 mg/dL — AB
Leukocytes, UA: NEGATIVE
NITRITE: NEGATIVE
PH: 6.5 (ref 5.0–8.0)
Protein, ur: NEGATIVE mg/dL
SPECIFIC GRAVITY, URINE: 1.01 (ref 1.005–1.030)

## 2017-07-04 NOTE — ED Notes (Signed)
Patient was given water and graham crackers, popcorn and peanut butter crackers to eat.

## 2017-07-04 NOTE — ED Notes (Signed)
Pt given d/c instructions as per chart. Verbalizes understanding. No questions. 

## 2017-09-10 ENCOUNTER — Encounter (HOSPITAL_COMMUNITY): Payer: Self-pay | Admitting: Emergency Medicine

## 2017-09-10 ENCOUNTER — Emergency Department (HOSPITAL_COMMUNITY): Payer: Self-pay

## 2017-09-10 ENCOUNTER — Emergency Department (HOSPITAL_COMMUNITY)
Admission: EM | Admit: 2017-09-10 | Discharge: 2017-09-10 | Disposition: A | Discharge status: Home / Self Care | Payer: Self-pay | Attending: Emergency Medicine | Admitting: Emergency Medicine

## 2017-09-10 ENCOUNTER — Other Ambulatory Visit: Payer: Self-pay

## 2017-09-10 DIAGNOSIS — Z760 Encounter for issue of repeat prescription: Secondary | ICD-10-CM | POA: Insufficient documentation

## 2017-09-10 DIAGNOSIS — K5900 Constipation, unspecified: Secondary | ICD-10-CM | POA: Insufficient documentation

## 2017-09-10 MED ORDER — POLYETHYLENE GLYCOL 3350 17 GM/SCOOP PO POWD: 17.0000 g | Freq: Every day | ORAL | 0 refills | Status: DC | PRN

## 2017-09-10 MED ORDER — DOCUSATE SODIUM 100 MG PO CAPS: 100.0000 mg | ORAL_CAPSULE | Freq: Two times a day (BID) | ORAL | 0 refills | Status: DC | PRN

## 2017-09-10 NOTE — ED Notes (Signed)
Pt called for triage no answer in lobby

## 2017-09-10 NOTE — ED Provider Notes (Signed)
[facility]   CSN: [id number] Arrival date & time: 09/10/17  0114     History   Chief Complaint Chief Complaint  Patient presents with  . Medication Refill    HPI Randy Fry is a 32 y.o. male.  Patient presents to the emergency department with a chief complaint of constipation.  He states that he has had difficulty with having bowel movements for the past 2 weeks.  He denies any fever, chills, nausea, vomiting.  He denies any abdominal pain.  He states that he was given a prescription for MiraLAX last time he was here, and would like to get a refill of this.  He denies any other associated symptoms.  There are no aggravating factors.  He has tried taking his stool softener at home with no relief.  The history is provided by the patient. No language interpreter was used.    Past Medical History:  Diagnosis Date  . Paranoid schizophrenia [facility](HCC)     Patient Active Problem List   Diagnosis Date Noted  . Diffuse abdominal pain 02/05/2017  . High anion gap metabolic acidosis 02/05/2017  . Paranoid schizophrenia (HCC) 04/28/2016    History reviewed. No pertinent surgical history.      Home Medications    Prior to Admission medications   Medication Sig Start Date End Date Taking? Authorizing Provider  docusate sodium (COLACE) 100 MG capsule Take 1 capsule (100 mg total) by mouth 2 (two) times daily as needed for mild constipation. 09/10/17   Roxy HorsemanBrowning, Yarethzy Croak, PA-C  polyethylene glycol powder (GLYCOLAX/MIRALAX) powder Take 17 g by mouth daily as needed for moderate constipation. 09/10/17   Roxy HorsemanBrowning, Ranvir Renovato, PA-C    Family History History reviewed. No pertinent family history.  Social History Social History   Tobacco Use  . Smoking status: Never Smoker  . Smokeless tobacco: Never Used  Substance Use Topics  . Alcohol use: No  . Drug use: No     Allergies   Patient has no known allergies.   Review of  Systems Review of Systems  All other systems reviewed and are negative.    Physical Exam Updated Vital Signs BP 102/78   Pulse 64   Temp 98.7 F (37.1 C) (Oral)   Resp 18   SpO2 100%   Physical Exam  Constitutional: He is oriented to person, place, and time. He appears well-developed and well-nourished.  HENT:  Head: Normocephalic and atraumatic.  Eyes: Pupils are equal, round, and reactive to light. Conjunctivae and EOM are normal. Right eye exhibits no discharge. Left eye exhibits no discharge. No scleral icterus.  Neck: Normal range of motion. Neck supple. No JVD present.  Cardiovascular: Normal rate, regular rhythm and normal heart sounds. Exam reveals no gallop and no friction rub.  No murmur heard. Pulmonary/Chest: Effort normal and breath sounds normal. No respiratory distress. He has no wheezes. He has no rales. He exhibits no tenderness.  Abdominal: Soft. He exhibits no distension and no mass. There is no tenderness. There is no rebound and no guarding.  No focal abdominal tenderness, no RLQ tenderness or pain at McBurney's point, no RUQ tenderness or Murphy's sign, no left-sided abdominal tenderness, no fluid wave, or signs of peritonitis   Musculoskeletal: Normal range of motion. He exhibits no edema or tenderness.  Neurological: He is alert and oriented to person, place, and time.  Skin: Skin is warm and dry.  Psychiatric: He has a normal mood and affect. His behavior is  normal. Judgment and thought content normal.  Nursing note and vitals reviewed.    ED Treatments / Results  Labs (all labs ordered are listed, but only abnormal results are displayed) Labs Reviewed - No data to display  EKG None  Radiology Dg Abd Acute W/chest  Result Date: 09/10/2017 CLINICAL DATA:  Constipation EXAM: DG ABDOMEN ACUTE W/ 1V CHEST COMPARISON:  None. FINDINGS: There is no evidence of dilated bowel loops or free intraperitoneal air. There are multiple fluid levels within the  right hemiabdomen. There is stool throughout the colon. No radiopaque calculi or other significant radiographic abnormality is seen. Heart size and mediastinal contours are within normal limits. Both lungs are clear. IMPRESSION: Fluid levels within the right hemiabdomen without visualized small bowel dilatation. No free air. No acute cardiopulmonary disease. Electronically Signed   By: Deatra Robinson M.D.   On: 09/10/2017 04:12    Procedures Procedures (including critical care time)  Medications Ordered in ED Medications - No data to display   Initial Impression / Assessment and Plan / ED Course  I have reviewed the triage vital signs and the nursing notes.  Pertinent labs & imaging results that were available during my care of the patient were reviewed by me and considered in my medical decision making (see chart for details).     Patient with reported constipation for the past 2 weeks.  His abdomen is soft and nontender.  He denies any abdominal pain.  His vital signs are unremarkable.  I have a low suspicion for obstruction.  Will discharge home with strict return precautions.  Patient will be given a refill of MiraLAX.  Final Clinical Impressions(s) / ED Diagnoses   Final diagnoses:  Constipation, unspecified constipation type    ED Discharge Orders        Ordered    docusate sodium (COLACE) 100 MG capsule  2 times daily PRN     09/10/17 0425    polyethylene glycol powder (GLYCOLAX/MIRALAX) powder  Daily PRN     09/10/17 0425       Roxy Horseman, PA-C 09/10/17 6213    Geoffery Lyons, MD 09/10/17 [phone]

## 2017-09-10 NOTE — ED Triage Notes (Signed)
Pt arrives to ED requesting RX for maalox. Also asking for flu shot. Informed patient that he does not need RX for any of these, but that we could get him an RX for maalox. Educated patient on flu vaccine schedule. Reports nasal congestion but no other s/s.

## 2017-12-10 ENCOUNTER — Encounter (HOSPITAL_COMMUNITY): Payer: Self-pay | Admitting: Emergency Medicine

## 2017-12-10 ENCOUNTER — Emergency Department (HOSPITAL_COMMUNITY)
Admission: EM | Admit: 2017-12-10 | Discharge: 2017-12-10 | Disposition: A | Discharge status: Home / Self Care | Payer: Medicaid Other | Attending: Emergency Medicine | Admitting: Emergency Medicine

## 2017-12-10 DIAGNOSIS — R0981 Nasal congestion: Secondary | ICD-10-CM

## 2017-12-10 NOTE — ED Provider Notes (Signed)
[facility] EMERGENCY DEPARTMENT Provider Note   CSN: [id number] Arrival date & time: 12/10/17  1936     History   Chief Complaint Chief Complaint  Patient presents with  . Follow-up    HPI Nadia Chernick is a 32 y.o. male with history of paranoid schizophrenia presents for evaluation after flu vaccine.  He received his flu vaccination at a [facility] 1 week ago and states that his paperwork told him to follow-up with "a physician, any physician ".  He denies any fevers, cough, shortness of breath, chest pain, nausea, or vomiting.  He has had flu vaccines in the past and has not had any allergic reaction or intolerance to the vaccine.  He denies facial swelling, difficulty breathing or swallowing, drooling, or rash.  He does note mild nasal congestion and eye watering which has been ongoing for "a while ".  He states he does not take medications for seasonal allergies.  Denies any other complaints at this time.  The history is provided by the patient.    Past Medical History:  Diagnosis Date  . Paranoid schizophrenia [facility])     Patient Active Problem List   Diagnosis Date Noted  . Diffuse abdominal pain 02/05/2017  . High anion gap metabolic acidosis 02/05/2017  . Paranoid schizophrenia (HCC) 04/28/2016    History reviewed. No pertinent surgical history.      Home Medications    Prior to Admission medications   Medication Sig Start Date End Date Taking? Authorizing Provider  docusate sodium (COLACE) 100 MG capsule Take 1 capsule (100 mg total) by mouth 2 (two) times daily as needed for mild constipation. 09/10/17   Roxy Horseman, PA-C  polyethylene glycol powder (GLYCOLAX/MIRALAX) powder Take 17 g by mouth daily as needed for moderate constipation. 09/10/17   Roxy Horseman, PA-C    Family History No family history on file.  Social History Social History   Tobacco Use  . Smoking status: Never Smoker  . Smokeless tobacco: Never Used    Substance Use Topics  . Alcohol use: No  . Drug use: No     Allergies   Patient has no known allergies.   Review of Systems Review of Systems  Constitutional: Negative for chills and fever.  HENT: Positive for congestion. Negative for drooling, sore throat and trouble swallowing.   Eyes: Negative for photophobia, pain, redness, itching and visual disturbance.  Respiratory: Negative for cough and shortness of breath.   Cardiovascular: Negative for chest pain.  Gastrointestinal: Negative for abdominal pain, nausea and vomiting.     Physical Exam Updated Vital Signs BP 119/74 (BP Location: Right Arm)   Pulse 95   Temp 98.5 F (36.9 C) (Oral)   Resp 14   Ht 5\' 9"  (1.753 m)   SpO2 100%   BMI 24.37 kg/m   Physical Exam  Constitutional: He appears well-developed and well-nourished. No distress.  HENT:  Head: Normocephalic and atraumatic.  Right Ear: Tympanic membrane, external ear and ear canal normal.  Left Ear: Tympanic membrane, external ear and ear canal normal.  Nose: Mucosal edema present. Right sinus exhibits no maxillary sinus tenderness and no frontal sinus tenderness. Left sinus exhibits no maxillary sinus tenderness and no frontal sinus tenderness.  Mouth/Throat: Uvula is midline, oropharynx is clear and moist and mucous membranes are normal. No trismus in the jaw. No posterior oropharyngeal edema or posterior oropharyngeal erythema. No tonsillar exudate.  Eyes: Pupils are equal, round, and reactive to light. Conjunctivae and EOM  are normal. Right eye exhibits no discharge. Left eye exhibits no discharge.  Neck: Normal range of motion. Neck supple. No JVD present. No tracheal deviation present.  Cardiovascular: Normal rate, regular rhythm and normal heart sounds.  Pulmonary/Chest: Effort normal and breath sounds normal. No stridor. No respiratory distress. He has no wheezes. He has no rales. He exhibits no tenderness.  Abdominal: Soft. Bowel sounds are normal. He  exhibits no distension. There is no tenderness. There is no guarding.  Musculoskeletal: He exhibits no edema.  Lymphadenopathy:    He has no cervical adenopathy.  Neurological: He is alert.  Skin: No erythema.  Psychiatric: He has a normal mood and affect. His behavior is normal.  Nursing note and vitals reviewed.    ED Treatments / Results  Labs (all labs ordered are listed, but only abnormal results are displayed) Labs Reviewed - No data to display  EKG None  Radiology No results found.  Procedures Procedures (including critical care time)  Medications Ordered in ED Medications - No data to display   Initial Impression / Assessment and Plan / ED Course  I have reviewed the triage vital signs and the nursing notes.  Pertinent labs & imaging results that were available during my care of the patient were reviewed by me and considered in my medical decision making (see chart for details).     Patient presents for evaluation after receiving flu vaccine 1 week ago.  He is afebrile, vital signs are stable.  He is nontoxic in appearance.  No evidence of anaphylaxis.  He notes mild nasal congestion and watery eyes which he states has been ongoing for "a while ".  Advised that he can take over-the-counter allergy medicines or nasal spray for management of the symptoms if they become bothersome to him but he declines any prescriptions for them today.  Lungs clear to auscultation, no evidence of pneumonia in the absence of fever, cough, or shortness of breath while accounts he is well-appearing.  Stable for discharge home with follow-up with PCP.  Discussed strict ED return precautions. Pt verbalized understanding of and agreement with plan and is safe for discharge home at this time.   Final Clinical Impressions(s) / ED Diagnoses   Final diagnoses:  Nasal congestion    ED Discharge Orders    None       Bennye Alm 12/10/17 2206    Terrilee Files, MD 12/11/17  1021

## 2017-12-10 NOTE — Discharge Instructions (Signed)
You can take over-the-counter allergy medicines for your nasal congestion and watery eyes.  These medicines include Claritin, Zyrtec, and Allegra.  You can also use Flonase for nasal congestion.  Drink plenty of water.  Follow-up with your primary care physician.  We typically do not do follow-ups after a flu vaccine in the emergency department.  Return to the emergency department if any concerning signs or symptoms develop.

## 2017-12-10 NOTE — ED Triage Notes (Signed)
Pt reports he is here for flu vaccine follow up; received it 1 wk ago, no current symptoms.

## 2018-01-08 ENCOUNTER — Encounter (HOSPITAL_COMMUNITY): Payer: Self-pay | Admitting: Emergency Medicine

## 2018-01-08 ENCOUNTER — Other Ambulatory Visit: Payer: Self-pay

## 2018-01-08 ENCOUNTER — Emergency Department (HOSPITAL_COMMUNITY)
Admission: EM | Admit: 2018-01-08 | Discharge: 2018-01-08 | Disposition: A | Discharge status: Home / Self Care | Payer: Medicaid Other | Attending: Emergency Medicine | Admitting: Emergency Medicine

## 2018-01-08 DIAGNOSIS — R000 Tachycardia, unspecified: Secondary | ICD-10-CM

## 2018-01-08 DIAGNOSIS — K5900 Constipation, unspecified: Secondary | ICD-10-CM

## 2018-01-08 LAB — I-STAT CHEM 8, ED
BUN: 20 mg/dL (ref 6–20)
CREATININE: 1 mg/dL (ref 0.61–1.24)
Calcium, Ion: 1.13 mmol/L — ABNORMAL LOW (ref 1.15–1.40)
Chloride: 103 mmol/L (ref 98–111)
GLUCOSE: 111 mg/dL — AB (ref 70–99)
HEMATOCRIT: 44 % (ref 39.0–52.0)
HEMOGLOBIN: 15 g/dL (ref 13.0–17.0)
Potassium: 3.6 mmol/L (ref 3.5–5.1)
Sodium: 140 mmol/L (ref 135–145)
TCO2: 26 mmol/L (ref 22–32)

## 2018-01-08 NOTE — ED Triage Notes (Signed)
Pt reports he feels like he is constipated. Pt reports a recent bowel movement today but reports they are irregular.

## 2018-01-08 NOTE — ED Notes (Signed)
Patient verbalizes understanding of discharge instructions. Opportunity for questioning and answers were provided. Armband removed by staff, pt discharged from ED home via POV.  

## 2018-01-08 NOTE — ED Provider Notes (Signed)
[facility] EMERGENCY DEPARTMENT Provider Note   CSN: [id number] Arrival date & time: 01/08/18  2049     History   Chief Complaint Chief Complaint  Patient presents with  . Constipation    HPI Jedidiah Yebra is a 32 y.o. male.  HPI 32 year old man presents today complaining of constipation.  He states he had a bowel movement recently but it was hard.  Otherwise he has been having fairly regular bowel movements.  He denies any abdominal pain, nausea, vomiting, or diarrhea.  He states he only had one glass of water today due to financial constraints.  He is living in a house with the electricity has been cut off.  He denies taking any medications, using intoxicants, or recently drinking alcohol. Past Medical History:  Diagnosis Date  . Paranoid schizophrenia [facility])     Patient Active Problem List   Diagnosis Date Noted  . Diffuse abdominal pain 02/05/2017  . High anion gap metabolic acidosis 02/05/2017  . Paranoid schizophrenia (HCC) 04/28/2016    History reviewed. No pertinent surgical history.      Home Medications    Prior to Admission medications   Medication Sig Start Date End Date Taking? Authorizing Provider  docusate sodium (COLACE) 100 MG capsule Take 1 capsule (100 mg total) by mouth 2 (two) times daily as needed for mild constipation. 09/10/17   Roxy Horseman, PA-C  polyethylene glycol powder (GLYCOLAX/MIRALAX) powder Take 17 g by mouth daily as needed for moderate constipation. 09/10/17   Roxy Horseman, PA-C    Family History No family history on file.  Social History Social History   Tobacco Use  . Smoking status: Never Smoker  . Smokeless tobacco: Never Used  Substance Use Topics  . Alcohol use: No  . Drug use: No     Allergies   Patient has no known allergies.   Review of Systems Review of Systems  All other systems reviewed and are negative.    Physical Exam Updated Vital Signs Ht 1.753 m (5\' 9" )   Wt 74.8  kg   BMI 24.35 kg/m   Physical Exam  Constitutional: He is oriented to person, place, and time. He appears well-developed and well-nourished.  HENT:  Head: Normocephalic and atraumatic.  Right Ear: External ear normal.  Left Ear: External ear normal.  Nose: Nose normal.  Mouth/Throat: Oropharynx is clear and moist.  Eyes: Pupils are equal, round, and reactive to light. Conjunctivae and EOM are normal.  Neck: Normal range of motion. Neck supple.  Cardiovascular: Tachycardia present.  Pulmonary/Chest: Effort normal and breath sounds normal.  Abdominal: Soft. Bowel sounds are normal. He exhibits no distension and no mass. There is no tenderness. There is no guarding.  Musculoskeletal: Normal range of motion.  Neurological: He is alert and oriented to person, place, and time.  Skin: Skin is warm and dry. Capillary refill takes less than 2 seconds.  Psychiatric: His speech is normal and behavior is normal. Judgment and thought content normal. His mood appears not anxious. His affect is blunt. His affect is not angry, not labile and not inappropriate. He is not actively hallucinating. Cognition and memory are normal. He does not exhibit a depressed mood. He is attentive.  Nursing note and vitals reviewed.    ED Treatments / Results  Labs (all labs ordered are listed, but only abnormal results are displayed) Labs Reviewed - No data to display  EKG EKG Interpretation  Date/Time:  Sunday January 08 2018 21:07:23 EST Ventricular Rate:  113 PR Interval:    QRS Duration: 83 QT Interval:  315 QTC Calculation: 432 R Axis:   79 Text Interpretation:  Sinus tachycardia ECG OTHERWISE WITHIN NORMAL LIMITS Confirmed by Margarita Grizzle [phone]) on 01/08/2018 9:34:53 PM   Radiology No results found.  Procedures Procedures (including critical care time)  Medications Ordered in ED Medications - No data to display   Initial Impression / Assessment and Plan / ED Course  I have reviewed the  triage vital signs and the nursing notes.  Pertinent labs & imaging results that were available during my care of the patient were reviewed by me and considered in my medical decision making (see chart for details).   Review of chart reveals tachycardia at each visit x >15     Final Clinical Impressions(s) / ED Diagnoses   Final diagnoses:  Constipation, unspecified constipation type  Tachycardia    ED Discharge Orders    None       Margarita Grizzle, MD 01/08/18 2137

## 2018-01-08 NOTE — Discharge Instructions (Addendum)
Please increase your fruit and vegetables and water intake Use metamucil per package instructions twice daily

## 2018-02-17 ENCOUNTER — Encounter (HOSPITAL_COMMUNITY): Payer: Self-pay | Admitting: Emergency Medicine

## 2018-02-17 ENCOUNTER — Emergency Department (HOSPITAL_COMMUNITY)
Admission: EM | Admit: 2018-02-17 | Discharge: 2018-02-17 | Disposition: A | Discharge status: Home / Self Care | Payer: Medicaid Other | Attending: Emergency Medicine | Admitting: Emergency Medicine

## 2018-02-17 DIAGNOSIS — K5900 Constipation, unspecified: Secondary | ICD-10-CM

## 2018-02-17 MED ORDER — POLYETHYLENE GLYCOL 3350 17 GM/SCOOP PO POWD: 17.0000 g | Freq: Two times a day (BID) | ORAL | 0 refills | Status: DC

## 2018-02-17 MED ORDER — DOCUSATE SODIUM 100 MG PO CAPS: 100.0000 mg | ORAL_CAPSULE | Freq: Two times a day (BID) | ORAL | 0 refills | Status: DC

## 2018-02-17 NOTE — ED Notes (Signed)
Patient verbalized understanding of dc instructions, sandwich and sprite given per patient's request.

## 2018-02-17 NOTE — ED Provider Notes (Signed)
[facility]   CSN: [id number] Arrival date & time: 02/17/18  0306     History   Chief Complaint Chief Complaint  Patient presents with  . Abdominal Pain    HPI Kyzen Askren is a 32 y.o. male.  Patient presents to the emergency department with a chief complaint of constipation.  He states that he was unable to have a bowel movement today.  He reports that he generally has a bowel movement once per week.  He denies any abdominal pain now, but states that he had some earlier.  Denies any fever, chills, nausea, or vomiting.  Denies any dysuria.  No treatments prior to arrival.  Denies any other complaints.  The history is provided by the patient. No language interpreter was used.    Past Medical History:  Diagnosis Date  . Paranoid schizophrenia [facility](HCC)     Patient Active Problem List   Diagnosis Date Noted  . Diffuse abdominal pain 02/05/2017  . High anion gap metabolic acidosis 02/05/2017  . Paranoid schizophrenia (HCC) 04/28/2016    History reviewed. No pertinent surgical history.      Home Medications    Prior to Admission medications   Medication Sig Start Date End Date Taking? Authorizing Provider  docusate sodium (COLACE) 100 MG capsule Take 1 capsule (100 mg total) by mouth 2 (two) times daily as needed for mild constipation. Patient not taking: Reported on 02/17/2018 09/10/17   Roxy HorsemanBrowning, Janin Kozlowski, PA-C  polyethylene glycol powder (GLYCOLAX/MIRALAX) powder Take 17 g by mouth daily as needed for moderate constipation. Patient not taking: Reported on 02/17/2018 09/10/17   Roxy HorsemanBrowning, Linah Klapper, PA-C    Family History No family history on file.  Social History Social History   Tobacco Use  . Smoking status: Never Smoker  . Smokeless tobacco: Never Used  Substance Use Topics  . Alcohol use: No  . Drug use: No     Allergies   Patient has no known allergies.   Review of Systems Review of Systems  All  other systems reviewed and are negative.    Physical Exam Updated Vital Signs BP 128/77   Pulse 99   Temp (!) 97.4 F (36.3 C) (Oral)   Resp 15   SpO2 99%   Physical Exam Vitals signs and nursing note reviewed.  Constitutional:      Appearance: He is well-developed.  HENT:     Head: Normocephalic and atraumatic.  Eyes:     General: No scleral icterus.       Right eye: No discharge.        Left eye: No discharge.     Conjunctiva/sclera: Conjunctivae normal.     Pupils: Pupils are equal, round, and reactive to light.  Neck:     Musculoskeletal: Normal range of motion and neck supple.     Vascular: No JVD.  Cardiovascular:     Rate and Rhythm: Normal rate and regular rhythm.     Heart sounds: Normal heart sounds. No murmur. No friction rub. No gallop.   Pulmonary:     Effort: Pulmonary effort is normal. No respiratory distress.     Breath sounds: Normal breath sounds. No wheezing or rales.  Chest:     Chest wall: No tenderness.  Abdominal:     General: There is no distension.     Palpations: Abdomen is soft. There is no mass.     Tenderness: There is no abdominal tenderness. There is no guarding or rebound.  Comments: No focal abdominal tenderness, no RLQ tenderness or pain at McBurney's point, no RUQ tenderness or Murphy's sign, no left-sided abdominal tenderness, no fluid wave, or signs of peritonitis   Musculoskeletal: Normal range of motion.        General: No tenderness.  Skin:    General: Skin is warm and dry.  Neurological:     Mental Status: He is alert and oriented to person, place, and time.  Psychiatric:        Behavior: Behavior normal.        Thought Content: Thought content normal.        Judgment: Judgment normal.      ED Treatments / Results  Labs (all labs ordered are listed, but only abnormal results are displayed) Labs Reviewed - No data to display  EKG None  Radiology No results found.  Procedures Procedures (including critical  care time)  Medications Ordered in ED Medications - No data to display   Initial Impression / Assessment and Plan / ED Course  I have reviewed the triage vital signs and the nursing notes.  Pertinent labs & imaging results that were available during my care of the patient were reviewed by me and considered in my medical decision making (see chart for details).    Patient with reported constipation.  Unable to have a bowel movement today.  Had some abdominal pain thereafter, but denies any pain now.  Denies any fever or vomiting.  Abdomen is soft and nontender.  Doubt surgical or acute abdomen.  Will prescribe MiraLAX and Colace.  Recommend gastroenterology follow-up as needed.  Final Clinical Impressions(s) / ED Diagnoses   Final diagnoses:  Constipation, unspecified constipation type    ED Discharge Orders         Ordered    polyethylene glycol powder (GLYCOLAX/MIRALAX) powder  2 times daily     02/17/18 0334    docusate sodium (COLACE) 100 MG capsule  Every 12 hours     02/17/18 0334           Roxy HorsemanBrowning, Ciria Bernardini, PA-C 02/17/18 0334    Ward, Layla MawKristen N, DO 02/17/18 [id number]

## 2018-02-17 NOTE — ED Triage Notes (Addendum)
Pt presents today for abd pain that began today. He reports it feels like gas. Pt denies fevers or chills. Reports he has had some constipation and has been taking laxatives, had small bm yesterday

## 2018-06-30 ENCOUNTER — Emergency Department (HOSPITAL_COMMUNITY)
Admission: EM | Admit: 2018-06-30 | Discharge: 2018-06-30 | Discharge status: Absent without leave | Payer: Medicaid Other

## 2018-06-30 NOTE — ED Notes (Signed)
Pt's name called for triage no answer 

## 2018-06-30 NOTE — ED Notes (Addendum)
Pt checked in on peds triage side and is now unable to be located.  No answer in waiting room.

## 2018-06-30 NOTE — ED Notes (Signed)
No answer in waiting room 

## 2018-07-16 ENCOUNTER — Emergency Department (HOSPITAL_COMMUNITY)
Admission: EM | Admit: 2018-07-16 | Discharge: 2018-07-16 | Disposition: A | Discharge status: Home / Self Care | Payer: Self-pay | Attending: Emergency Medicine | Admitting: Emergency Medicine

## 2018-07-16 ENCOUNTER — Other Ambulatory Visit: Payer: Self-pay

## 2018-07-16 ENCOUNTER — Emergency Department (HOSPITAL_COMMUNITY): Payer: Self-pay

## 2018-07-16 ENCOUNTER — Encounter (HOSPITAL_COMMUNITY): Payer: Self-pay

## 2018-07-16 DIAGNOSIS — R197 Diarrhea, unspecified: Secondary | ICD-10-CM | POA: Insufficient documentation

## 2018-07-16 DIAGNOSIS — K5900 Constipation, unspecified: Secondary | ICD-10-CM

## 2018-07-16 DIAGNOSIS — Z79899 Other long term (current) drug therapy: Secondary | ICD-10-CM | POA: Insufficient documentation

## 2018-07-16 DIAGNOSIS — R109 Unspecified abdominal pain: Secondary | ICD-10-CM | POA: Insufficient documentation

## 2018-07-16 DIAGNOSIS — R1084 Generalized abdominal pain: Secondary | ICD-10-CM

## 2018-07-16 LAB — COMPREHENSIVE METABOLIC PANEL
ALT: 15 U/L (ref 0–44)
AST: 16 U/L (ref 15–41)
Albumin: 4.7 g/dL (ref 3.5–5.0)
Alkaline Phosphatase: 70 U/L (ref 38–126)
Anion gap: 11 (ref 5–15)
BUN: 11 mg/dL (ref 6–20)
CO2: 25 mmol/L (ref 22–32)
Calcium: 9.4 mg/dL (ref 8.9–10.3)
Chloride: 103 mmol/L (ref 98–111)
Creatinine, Ser: 1.13 mg/dL (ref 0.61–1.24)
GFR calc Af Amer: >60 mL/min (ref >60)
GFR calc non Af Amer: >60 mL/min (ref >60)
Glucose, Bld: 91 mg/dL (ref 70–99)
Potassium: 3.7 mmol/L (ref 3.5–5.1)
Sodium: 139 mmol/L (ref 135–145)
Total Bilirubin: 1 mg/dL (ref 0.3–1.2)
Total Protein: 7.8 g/dL (ref 6.5–8.1)

## 2018-07-16 LAB — URINALYSIS, ROUTINE W REFLEX MICROSCOPIC
Bilirubin Urine: NEGATIVE
Glucose, UA: NEGATIVE mg/dL
Hgb urine dipstick: NEGATIVE
Ketones, ur: NEGATIVE mg/dL
Nitrite: NEGATIVE
Protein, ur: NEGATIVE mg/dL
Specific Gravity, Urine: 1.014 (ref 1.005–1.030)
pH: 5 (ref 5.0–8.0)

## 2018-07-16 LAB — CBC
HCT: 42.6 % (ref 39.0–52.0)
Hemoglobin: 14 g/dL (ref 13.0–17.0)
MCH: 27.7 pg (ref 26.0–34.0)
MCHC: 32.9 g/dL (ref 30.0–36.0)
MCV: 84.4 fL (ref 80.0–100.0)
Platelets: 293 10*3/uL (ref 150–400)
RBC: 5.05 MIL/uL (ref 4.22–5.81)
RDW: 13.2 % (ref 11.5–15.5)
WBC: 4.2 10*3/uL (ref 4.0–10.5)
nRBC: 0 % (ref 0.0–0.2)

## 2018-07-16 LAB — LIPASE, BLOOD: Lipase: 29 U/L (ref 11–51)

## 2018-07-16 MED ORDER — POLYETHYLENE GLYCOL 3350 17 G PO PACK
17.0000 g | PACK | Freq: Every day | ORAL | Status: DC
  Administered 2018-07-16: 17:00:00 17 g via ORAL
  Filled 2018-07-16: qty 1

## 2018-07-16 MED ORDER — ACETAMINOPHEN 500 MG PO TABS
1000.0000 mg | ORAL_TABLET | Freq: Once | ORAL | Status: AC
  Administered 2018-07-16: 17:00:00 1000 mg via ORAL
  Filled 2018-07-16: qty 2

## 2018-07-16 MED ORDER — SODIUM CHLORIDE 0.9 % IV BOLUS
1000.0000 mL | Freq: Once | INTRAVENOUS | Status: AC
  Administered 2018-07-16: 1000 mL via INTRAVENOUS

## 2018-07-16 MED ORDER — SODIUM CHLORIDE 0.9% FLUSH
3.0000 mL | Freq: Once | INTRAVENOUS | Status: AC
  Administered 2018-07-16: 17:00:00 3 mL via INTRAVENOUS

## 2018-07-16 MED ORDER — POLYETHYLENE GLYCOL 3350 17 G PO PACK: 10.0000 g | PACK | Freq: Every day | ORAL | 0 refills | Status: DC

## 2018-07-16 NOTE — Discharge Instructions (Signed)
Use MiraLAX until stool has regular frequency and consistency. Return for fevers, localized pain, persistent vomiting or new concerns.

## 2018-07-16 NOTE — ED Triage Notes (Signed)
Pt reports generalized abd pain that started just prior to arrival. Pt also c.o not having a BM in 3 weeks. Pt tachycardic in triage [phone]. Pt a.o, nad noted.

## 2018-07-16 NOTE — ED Provider Notes (Signed)
[facility] EMERGENCY DEPARTMENT Provider Note   CSN: [id number] Arrival date & time: 07/16/18  1444    History   Chief Complaint Chief Complaint  Patient presents with  . Abdominal Pain  . Constipation    HPI Randy Fry is a 33 y.o. male.     Patient with history of paranoid schizophrenia, constipation presents with generalized abdominal pain that has had intermittent for 3 weeks.  Last bowel movement proximally 3 weeks ago.  Patient denies abdominal surgeries.  Patient denies new foods vomiting or diarrhea.  No recent travel no fevers.  Diffuse cramping.     Past Medical History:  Diagnosis Date  . Paranoid schizophrenia [facility])     Patient Active Problem List   Diagnosis Date Noted  . Diffuse abdominal pain 02/05/2017  . High anion gap metabolic acidosis 02/05/2017  . Paranoid schizophrenia (HCC) 04/28/2016    History reviewed. No pertinent surgical history.      Home Medications    Prior to Admission medications   Medication Sig Start Date End Date Taking? Authorizing Provider  Bisacodyl (LAXATIVE PO) Take 1 Dose by mouth daily as needed (Constipation).   Yes [provider]  diphenhydrAMINE (SOMINEX) 25 MG tablet Take 25 mg by mouth at bedtime as needed for sleep.   Yes [provider]  docusate sodium (COLACE) 100 MG capsule Take 1 capsule (100 mg total) by mouth every 12 (twelve) hours. Patient not taking: Reported on 07/16/2018 02/17/18   Roxy Horseman, PA-C  polyethylene glycol (MIRALAX / GLYCOLAX) 17 g packet Take 10 g by mouth daily. 07/16/18   Blane Ohara, MD  polyethylene glycol powder (GLYCOLAX/MIRALAX) powder Take 17 g by mouth 2 (two) times daily. Patient not taking: Reported on 07/16/2018 02/17/18   Roxy Horseman, PA-C    Family History No family history on file.  Social History Social History   Tobacco Use  . Smoking status: Never Smoker  . Smokeless tobacco: Never Used  Substance Use Topics   . Alcohol use: No  . Drug use: No     Allergies   Patient has no known allergies.   Review of Systems Review of Systems  Constitutional: Negative for chills and fever.  HENT: Negative for congestion.   Eyes: Negative for visual disturbance.  Respiratory: Negative for shortness of breath.   Cardiovascular: Negative for chest pain.  Gastrointestinal: Positive for abdominal pain. Negative for vomiting.  Genitourinary: Negative for dysuria and flank pain.  Musculoskeletal: Negative for back pain, neck pain and neck stiffness.  Skin: Negative for rash.  Neurological: Negative for light-headedness and headaches.     Physical Exam Updated Vital Signs BP 112/75   Pulse 88   Temp 97.9 F (36.6 C) (Oral)   Resp 16   SpO2 100%   Physical Exam Vitals signs and nursing note reviewed.  Constitutional:      Appearance: He is well-developed.  HENT:     Head: Normocephalic and atraumatic.  Eyes:     General:        Right eye: No discharge.        Left eye: No discharge.     Conjunctiva/sclera: Conjunctivae normal.  Neck:     Musculoskeletal: Normal range of motion and neck supple.     Trachea: No tracheal deviation.  Cardiovascular:     Rate and Rhythm: Regular rhythm. Tachycardia present.  Pulmonary:     Effort: Pulmonary effort is normal.     Breath sounds: Normal breath sounds.  Abdominal:     General: There is no distension.     Palpations: Abdomen is soft.     Tenderness: There is no abdominal tenderness. There is no guarding.  Skin:    General: Skin is warm.     Findings: No rash.  Neurological:     Mental Status: He is alert and oriented to person, place, and time.      ED Treatments / Results  Labs (all labs ordered are listed, but only abnormal results are displayed) Labs Reviewed  URINALYSIS, ROUTINE W REFLEX MICROSCOPIC - Abnormal; Notable for the following components:      Result Value   APPearance HAZY (*)    Leukocytes,Ua SMALL (*)    Bacteria,  UA RARE (*)    All other components within normal limits  URINE CULTURE  LIPASE, BLOOD  COMPREHENSIVE METABOLIC PANEL  CBC    EKG None  Radiology Dg Abdomen Acute W/chest  Result Date: 07/16/2018 CLINICAL DATA:  33 year old presenting with generalized abdominal pain and constipation (patient states no bowel movement in 2-3 weeks). EXAM: DG ABDOMEN ACUTE W/ 1V CHEST COMPARISON:  09/10/2017 and earlier, including CT abdomen and pelvis 07/03/2017. FINDINGS: Bowel gas pattern unremarkable without evidence of obstruction or significant ileus. No evidence of free air or significant air-fluid levels on the erect image. Moderate to large stool burden in the colon. No abnormal calcifications. Regional skeleton unremarkable. Cardiomediastinal silhouette unremarkable and unchanged. Lungs clear. Bronchovascular markings normal. Pulmonary vascularity normal. No visible pleural effusions. No pneumothorax. IMPRESSION: No acute abdominal or pulmonary abnormality. Moderate to large colonic stool burden. Electronically Signed   By: Hulan Saashomas  Lawrence M.D.   On: 07/16/2018 16:42    Procedures Procedures (including critical care time)  Medications Ordered in ED Medications  polyethylene glycol (MIRALAX / GLYCOLAX) packet 17 g (17 g Oral Given 07/16/18 1704)  sodium chloride flush (NS) 0.9 % injection 3 mL (3 mLs Intravenous Given 07/16/18 1712)  sodium chloride 0.9 % bolus 1,000 mL (1,000 mLs Intravenous New Bag/Given 07/16/18 1712)  acetaminophen (TYLENOL) tablet 1,000 mg (1,000 mg Oral Given 07/16/18 1704)     Initial Impression / Assessment and Plan / ED Course  I have reviewed the triage vital signs and the nursing notes.  Pertinent labs & imaging results that were available during my care of the patient were reviewed by me and considered in my medical decision making (see chart for details).       Patient with schizophrenia and constipation history presents with constipation-like symptoms.   Concerning clinically for heart rate initially 140, afebrile, no focal tenderness on exam at this time.  Plan for screening blood work, monitoring heart rate, IV fluid bolus, Tylenol for pain and MiraLAX.  X-ray ordered to look for any evidence of dilatation/constipation.  If patient improves we will avoid radiation risk of CT scan and discharge with stool softeners.   X-ray showed significant stool burden.  Vital signs normalized.  Blood work reviewed no acute abnormalities normal white blood cell count, normal hemoglobin.  Patient stable for outpatient follow-up.  MiraLAX.   Final Clinical Impressions(s) / ED Diagnoses   Final diagnoses:  Generalized abdominal pain  Constipation, unspecified constipation type    ED Discharge Orders         Ordered    polyethylene glycol (MIRALAX / GLYCOLAX) 17 g packet  Daily     07/16/18 1739           Blane OharaZavitz, Elinor Kleine, MD 07/16/18 1740

## 2018-07-17 LAB — URINE CULTURE: Culture: <10000 — AB

## 2018-09-22 ENCOUNTER — Emergency Department (HOSPITAL_COMMUNITY)
Admission: EM | Admit: 2018-09-22 | Discharge: 2018-09-23 | Disposition: A | Discharge status: Home / Self Care | Payer: Self-pay | Attending: Emergency Medicine | Admitting: Emergency Medicine

## 2018-09-22 ENCOUNTER — Emergency Department (HOSPITAL_COMMUNITY): Payer: Self-pay

## 2018-09-22 ENCOUNTER — Other Ambulatory Visit: Payer: Self-pay

## 2018-09-22 ENCOUNTER — Encounter (HOSPITAL_COMMUNITY): Payer: Self-pay

## 2018-09-22 DIAGNOSIS — Z79899 Other long term (current) drug therapy: Secondary | ICD-10-CM | POA: Insufficient documentation

## 2018-09-22 DIAGNOSIS — K5900 Constipation, unspecified: Secondary | ICD-10-CM | POA: Insufficient documentation

## 2018-09-22 LAB — CBC
HCT: 43.9 % (ref 39.0–52.0)
Hemoglobin: 14.1 g/dL (ref 13.0–17.0)
MCH: 27.7 pg (ref 26.0–34.0)
MCHC: 32.1 g/dL (ref 30.0–36.0)
MCV: 86.2 fL (ref 80.0–100.0)
Platelets: 339 10*3/uL (ref 150–400)
RBC: 5.09 MIL/uL (ref 4.22–5.81)
RDW: 13.1 % (ref 11.5–15.5)
WBC: 5.7 10*3/uL (ref 4.0–10.5)
nRBC: 0 % (ref 0.0–0.2)

## 2018-09-22 LAB — COMPREHENSIVE METABOLIC PANEL
ALT: 20 U/L (ref 0–44)
AST: 17 U/L (ref 15–41)
Albumin: 4.8 g/dL (ref 3.5–5.0)
Alkaline Phosphatase: 67 U/L (ref 38–126)
Anion gap: 12 (ref 5–15)
BUN: 14 mg/dL (ref 6–20)
CO2: 23 mmol/L (ref 22–32)
Calcium: 9.7 mg/dL (ref 8.9–10.3)
Chloride: 110 mmol/L (ref 98–111)
Creatinine, Ser: 1.15 mg/dL (ref 0.61–1.24)
GFR calc Af Amer: >60 mL/min (ref >60)
GFR calc non Af Amer: >60 mL/min (ref >60)
Glucose, Bld: 92 mg/dL (ref 70–99)
Potassium: 4.6 mmol/L (ref 3.5–5.1)
Sodium: 145 mmol/L (ref 135–145)
Total Bilirubin: 0.9 mg/dL (ref 0.3–1.2)
Total Protein: 8.3 g/dL — ABNORMAL HIGH (ref 6.5–8.1)

## 2018-09-22 LAB — LIPASE, BLOOD: Lipase: 27 U/L (ref 11–51)

## 2018-09-22 NOTE — ED Provider Notes (Signed)
[facility]   CSN: [id number] Arrival date & time: 09/22/18  1634     History   Chief Complaint Chief Complaint  Patient presents with  . Constipation  . Tachycardia    HPI Randy Fry is a 33 y.o. male.     The history is provided by the patient and medical records.     33 y.o. M with hx of paranoid schizophrenia, presenting to the ED with feelings of constipation.  Reports he has been having this issue for several months but is gotten worse over the past few days.  He is not having any abdominal pain but states it feels very "full".  He was prescribed laxatives at prior ED visit which she has been taking without relief.  He does report he had a bowel movement this morning but had to strain quite a bit and it was very small and hard.  He denies any vomiting, fever, or chills.  He has not had any difficulty urinating.  Used miralax today-- 1 capful in water.  Past Medical History:  Diagnosis Date  . Paranoid schizophrenia [facility](HCC)     Patient Active Problem List   Diagnosis Date Noted  . Diffuse abdominal pain 02/05/2017  . High anion gap metabolic acidosis 02/05/2017  . Paranoid schizophrenia (HCC) 04/28/2016    History reviewed. No pertinent surgical history.      Home Medications    Prior to Admission medications   Medication Sig Start Date End Date Taking? Authorizing Provider  Bisacodyl (LAXATIVE PO) Take 1 Dose by mouth daily as needed (Constipation).    [provider]  diphenhydrAMINE (SOMINEX) 25 MG tablet Take 25 mg by mouth at bedtime as needed for sleep.    [provider]  docusate sodium (COLACE) 100 MG capsule Take 1 capsule (100 mg total) by mouth every 12 (twelve) hours. Patient not taking: Reported on 07/16/2018 02/17/18   Roxy HorsemanBrowning, Robert, PA-C  polyethylene glycol (MIRALAX / GLYCOLAX) 17 g packet Take 10 g by mouth daily. 07/16/18   Blane OharaZavitz, Joshua, MD  polyethylene glycol  powder (GLYCOLAX/MIRALAX) powder Take 17 g by mouth 2 (two) times daily. Patient not taking: Reported on 07/16/2018 02/17/18   Roxy HorsemanBrowning, Robert, PA-C    Family History History reviewed. No pertinent family history.  Social History Social History   Tobacco Use  . Smoking status: Never Smoker  . Smokeless tobacco: Never Used  Substance Use Topics  . Alcohol use: No  . Drug use: No     Allergies   Patient has no known allergies.   Review of Systems Review of Systems  Gastrointestinal: Positive for constipation.  All other systems reviewed and are negative.    Physical Exam Updated Vital Signs BP 122/65   Pulse 83   Temp 99.6 F (37.6 C) (Oral)   Resp 16   SpO2 100%   Physical Exam Vitals signs and nursing note reviewed.  Constitutional:      Appearance: He is well-developed.  HENT:     Head: Normocephalic and atraumatic.  Eyes:     Conjunctiva/sclera: Conjunctivae normal.     Pupils: Pupils are equal, round, and reactive to light.  Neck:     Musculoskeletal: Normal range of motion.  Cardiovascular:     Rate and Rhythm: Normal rate and regular rhythm.     Heart sounds: Normal heart sounds.  Pulmonary:     Effort: Pulmonary effort is normal.     Breath sounds: Normal breath  sounds.  Abdominal:     General: Bowel sounds are normal.     Palpations: Abdomen is soft.     Tenderness: There is no abdominal tenderness. There is no rebound.     Comments: Soft, nontender, no peritoneal signs  Musculoskeletal: Normal range of motion.  Skin:    General: Skin is warm and dry.  Neurological:     Mental Status: He is alert and oriented to person, place, and time.      ED Treatments / Results  Labs (all labs ordered are listed, but only abnormal results are displayed) Labs Reviewed  COMPREHENSIVE METABOLIC PANEL - Abnormal; Notable for the following components:      Result Value   Total Protein 8.3 (*)    All other components within normal limits  LIPASE,  BLOOD  CBC    EKG None  Radiology Dg Abd Acute 2+v W 1v Chest  Result Date: 09/22/2018 CLINICAL DATA:  Constipation for several months EXAM: DG ABDOMEN ACUTE W/ 1V CHEST COMPARISON:  07/16/2018 FINDINGS: Cardiac shadows within normal limits. The lungs are clear bilaterally. Scattered large and small bowel gas is noted. Mild retained fecal material is noted less prominent than that seen on the prior exam. No obstructive changes seen. No free air is noted. No mass lesion is seen. No bony abnormality is noted. IMPRESSION: Mild retained fecal material within the colon although improved when compared with the prior study. Electronically Signed   By: Inez Catalina M.D.   On: 09/22/2018 23:35    Procedures Procedures (including critical care time)  Medications Ordered in ED Medications - No data to display   Initial Impression / Assessment and Plan / ED Course  I have reviewed the triage vital signs and the nursing notes.  Pertinent labs & imaging results that were available during my care of the patient were reviewed by me and considered in my medical decision making (see chart for details).  33 year old male here with complaint of constipation.  This is a recurrent issue.  States he has been using MiraLAX without relief, however did have a small bowel movement this morning.  He is afebrile and nontoxic.  His abdomen is soft and benign without any focal tenderness or peritoneal signs.  Labs are reassuring.  Acute abdominal series with stool throughout the colon, however improved from prior.  We will have him increase to his MiraLAX twice daily, if still no significant relief can increase to 2 capsules twice daily and then reduce as tolerated.  Continue good oral hydration, high fiber diet.  Follow-up with PCP.  Return here for any new or acute changes.  Final Clinical Impressions(s) / ED Diagnoses   Final diagnoses:  Constipation, unspecified constipation type    ED Discharge Orders     None       Larene Pickett, PA-C 09/23/18 [place], [place], DO 09/23/18 [phone]

## 2018-09-22 NOTE — ED Triage Notes (Signed)
Pt endorses constipation for several months. Seen here prior and prescribed a laxative which helped some. Pt states it's gotten much worse over the last 2 days. LBM today but it was small. Tachy in triage with rate of 150. Denies n/v

## 2018-09-23 NOTE — Discharge Instructions (Signed)
Increase miralax to 1 capful in 8oz of water twice daily.  If still no improvement, do 2 capfuls with each dose.  Can reduce back to once daily or as needed after bowel movements return to normal. Make sure to drink adequate fluids, continue eating vegetables and fiber. Follow-up with your primary care doctor. Return here for any new/acute changes.

## 2018-09-23 NOTE — ED Notes (Signed)
Patient verbalizes understanding of discharge instructions. Opportunity for questioning and answers were provided. Armband removed by staff, pt discharged from ED.  

## 2019-05-09 ENCOUNTER — Emergency Department (HOSPITAL_COMMUNITY)
Admission: EM | Admit: 2019-05-09 | Discharge: 2019-05-09 | Disposition: A | Discharge status: Home / Self Care | Payer: Self-pay | Attending: Emergency Medicine | Admitting: Emergency Medicine

## 2019-05-09 ENCOUNTER — Encounter (HOSPITAL_COMMUNITY): Payer: Self-pay | Admitting: Emergency Medicine

## 2019-05-09 ENCOUNTER — Other Ambulatory Visit: Payer: Self-pay

## 2019-05-09 ENCOUNTER — Emergency Department (HOSPITAL_COMMUNITY): Payer: Self-pay

## 2019-05-09 DIAGNOSIS — F200 Paranoid schizophrenia: Secondary | ICD-10-CM | POA: Insufficient documentation

## 2019-05-09 DIAGNOSIS — K5900 Constipation, unspecified: Secondary | ICD-10-CM | POA: Insufficient documentation

## 2019-05-09 DIAGNOSIS — R1084 Generalized abdominal pain: Secondary | ICD-10-CM | POA: Insufficient documentation

## 2019-05-09 HISTORY — DX: Constipation, unspecified: K59.00

## 2019-05-09 LAB — COMPREHENSIVE METABOLIC PANEL
ALT: 17 U/L (ref 0–44)
AST: 15 U/L (ref 15–41)
Albumin: 4.1 g/dL (ref 3.5–5.0)
Alkaline Phosphatase: 56 U/L (ref 38–126)
Anion gap: 9 (ref 5–15)
BUN: 15 mg/dL (ref 6–20)
CO2: 27 mmol/L (ref 22–32)
Calcium: 9.2 mg/dL (ref 8.9–10.3)
Chloride: 105 mmol/L (ref 98–111)
Creatinine, Ser: 1.04 mg/dL (ref 0.61–1.24)
GFR calc Af Amer: >60 mL/min (ref >60)
GFR calc non Af Amer: >60 mL/min (ref >60)
Glucose, Bld: 112 mg/dL — ABNORMAL HIGH (ref 70–99)
Potassium: 3.7 mmol/L (ref 3.5–5.1)
Sodium: 141 mmol/L (ref 135–145)
Total Bilirubin: 1 mg/dL (ref 0.3–1.2)
Total Protein: 6.9 g/dL (ref 6.5–8.1)

## 2019-05-09 LAB — URINALYSIS, ROUTINE W REFLEX MICROSCOPIC
Bilirubin Urine: NEGATIVE
Glucose, UA: NEGATIVE mg/dL
Hgb urine dipstick: NEGATIVE
Ketones, ur: NEGATIVE mg/dL
Nitrite: NEGATIVE
Protein, ur: NEGATIVE mg/dL
Specific Gravity, Urine: 1.025 (ref 1.005–1.030)
pH: 6 (ref 5.0–8.0)

## 2019-05-09 LAB — CBC
HCT: 41.9 % (ref 39.0–52.0)
Hemoglobin: 13.6 g/dL (ref 13.0–17.0)
MCH: 28 pg (ref 26.0–34.0)
MCHC: 32.5 g/dL (ref 30.0–36.0)
MCV: 86.2 fL (ref 80.0–100.0)
Platelets: 297 10*3/uL (ref 150–400)
RBC: 4.86 MIL/uL (ref 4.22–5.81)
RDW: 13 % (ref 11.5–15.5)
WBC: 3.5 10*3/uL — ABNORMAL LOW (ref 4.0–10.5)
nRBC: 0 % (ref 0.0–0.2)

## 2019-05-09 LAB — LIPASE, BLOOD: Lipase: 26 U/L (ref 11–51)

## 2019-05-09 MED ORDER — MAGNESIUM CITRATE PO SOLN: 1.0000 | Freq: Once | ORAL | 0 refills | Status: DC

## 2019-05-09 MED ORDER — SODIUM CHLORIDE 0.9% FLUSH: 3.0000 mL | Freq: Once | INTRAVENOUS | Status: DC

## 2019-05-09 NOTE — ED Provider Notes (Addendum)
[facility] EMERGENCY DEPARTMENT Provider Note   CSN: [id number] Arrival date & time: 05/09/19  1012     History Chief Complaint  Patient presents with  . Constipation  . Abdominal Pain    Randy Fry is a 34 y.o. male.  34 y.o male with a PMH schizophrenia, recurrent constipation presents to the ED with complaints of constipation for the past 10 days.  Patient reports his last bowel movement was exactly 10 days ago, he is currently passing gas however the strain when having a bowel movement.  He also endorses abdominal pain, this is generalized and feels somewhat similar to his prior episodes of constipation.  He has been seen in the ED for the same complaint, with this being his 11th visit for the same complaint.  He is currently taking MiraLAX daily, states he only takes this once a day.  He has not had any changes in his diet.  Reports his diet is mainly consistent of cucumbers, "stuff that you get at the grocery store ".  He has not had any nausea, vomiting, fevers.No prior surgical history to his abdomen.   The history is provided by the patient and medical records.  Constipation Associated symptoms: abdominal pain   Associated symptoms: no back pain, no fever, no nausea and no vomiting   Abdominal Pain Associated symptoms: constipation   Associated symptoms: no fever, no nausea and no vomiting        Past Medical History:  Diagnosis Date  . Constipation   . Paranoid schizophrenia [facility])     Patient Active Problem List   Diagnosis Date Noted  . Diffuse abdominal pain 02/05/2017  . High anion gap metabolic acidosis 79/89/2119  . Paranoid schizophrenia ([place]) 04/28/2016    History reviewed. No pertinent surgical history.     No family history on file.  Social History   Tobacco Use  . Smoking status: Never Smoker  . Smokeless tobacco: Never Used  Substance Use Topics  . Alcohol use: No  . Drug use: No    Home Medications Prior to  Admission medications   Not on File    Allergies    Patient has no known allergies.  Review of Systems   Review of Systems  Constitutional: Negative for fever.  HENT: Negative for sinus pressure.   Gastrointestinal: Positive for abdominal pain and constipation. Negative for nausea and vomiting.  Genitourinary: Negative for flank pain.  Musculoskeletal: Negative for back pain.  Skin: Negative for pallor and wound.  Neurological: Negative for light-headedness, numbness and headaches.  All other systems reviewed and are negative.   Physical Exam Updated Vital Signs BP (!) 116/95 (BP Location: Left Arm)   Pulse 83   Temp 98.3 F (36.8 C)   Resp 17   Ht 5\' 8"  (1.727 m)   Wt 68 kg   SpO2 99%   BMI 22.81 kg/m   Physical Exam Vitals and nursing note reviewed.  Constitutional:      Appearance: He is well-developed.  HENT:     Head: Normocephalic and atraumatic.  Eyes:     General: No scleral icterus.    Pupils: Pupils are equal, round, and reactive to light.  Cardiovascular:     Heart sounds: Normal heart sounds.     Comments: No pitting edema.  Pulmonary:     Effort: Pulmonary effort is normal.     Breath sounds: Normal breath sounds. No wheezing.  Chest:     Chest wall: No tenderness.  Abdominal:     General: Bowel sounds are decreased. There is no distension.     Palpations: Abdomen is soft.     Tenderness: There is no abdominal tenderness. There is no right CVA tenderness or left CVA tenderness.     Hernia: No hernia is present.     Comments: Soft, non tender to palpation. Bowel sounds are decreased.   Musculoskeletal:        General: No tenderness or deformity.     Cervical back: Normal range of motion.     Right lower leg: No edema.     Left lower leg: No edema.  Skin:    General: Skin is warm and dry.  Neurological:     Mental Status: He is alert and oriented to person, place, and time.     ED Results / Procedures / Treatments   Labs (all labs  ordered are listed, but only abnormal results are displayed) Labs Reviewed  COMPREHENSIVE METABOLIC PANEL - Abnormal; Notable for the following components:      Result Value   Glucose, Bld 112 (*)    All other components within normal limits  CBC - Abnormal; Notable for the following components:   WBC 3.5 (*)    All other components within normal limits  LIPASE, BLOOD  URINALYSIS, ROUTINE W REFLEX MICROSCOPIC    EKG None  Radiology DG Abdomen 1 View  Result Date: 05/09/2019 CLINICAL DATA:  Constipation. Abdominal discomfort. EXAM: ABDOMEN - 1 VIEW COMPARISON:  09/22/2018 FINDINGS: Nonobstructive bowel gas pattern. Moderate amount of fecal matter within the colon, within the range of normal. No abnormal calcifications or bone findings. IMPRESSION: Within normal limits. Moderate amount of fecal matter within the colon, within the range of normal radiographically. Electronically Signed   By: Paulina Fusi M.D.   On: 05/09/2019 12:39    Procedures Procedures (including critical care time)  Medications Ordered in ED Medications  sodium chloride flush (NS) 0.9 % injection 3 mL (3 mLs Intravenous Not Given 05/09/19 1128)    ED Course  I have reviewed the triage vital signs and the nursing notes.  Pertinent labs & imaging results that were available during my care of the patient were reviewed by me and considered in my medical decision making (see chart for details).    MDM Rules/Calculators/A&P   Patient here with his 11th visit for constipation into the ED.  Reports "I am here for follow-up ".  Does report the symptoms have been occurring for the past 10 days, has tried MiraLAX daily and takes it "sometimes "however has had to strain in order to pass bowel movements.  He is currently passing gas, has had no vomiting, nausea, fevers.  Reports that his diet is fairly green at home.  Arrived in the ED with a heart rate in the 125, this has now normalized after sitting in bed, he is  afebrile, no hypoxia blood pressure is within normal limits.  His abdominal exam is benign, soft, bowel sounds are slightly diminished.  Lungs are clear to auscultation and he is nontoxic, not ill-appearing.  Interpretation of his labs today show a CMP without any electrolyte abnormalities due to this constipation.  Glucose is slightly elevated.  Is not have any history of prior diabetes.  Kidney function along with LFTs are within normal limits, pain does not seem to be focal.  CBC is within normal limits.  Lipase level is unremarkable.  X-ray of your abdomen showed: Within normal limits. Moderate amount of  fecal matter within the  colon, within the range of normal radiographically.        These results were discussed at length with patient, he was also given a referral for PCP follow-up along with talked about managing his chronic constipation be outpatient.  He is nontoxic, tolerating p.o. adequately, he was requesting IV fluids although work-up so far with labs that do not show any dehydration.  I have advised patient he will need to increase his MiraLAX to twice daily,along with purchase OTC dulcolax. He understands and agrees with management.    Portions of this note were generated with Scientist, clinical (histocompatibility and immunogenetics). Dictation errors may occur despite best attempts at proofreading.  Final Clinical Impression(s) / ED Diagnoses Final diagnoses:  Generalized abdominal pain  Constipation, unspecified constipation type    Rx / DC Orders ED Discharge Orders         Ordered    magnesium citrate SOLN   Once,   Status:  Discontinued     05/09/19 1404           Claude Manges, PA-C 05/09/19 1412    Claude Manges, PA-C 05/09/19 1413    Cathren Laine, MD 05/10/19 [phone]

## 2019-05-09 NOTE — ED Triage Notes (Signed)
C/o generalized abd pain and constipation that has been ongoing since last seen here in July for same.

## 2019-05-09 NOTE — ED Notes (Signed)
Pt went to x-ray.

## 2019-05-09 NOTE — Discharge Instructions (Addendum)
Your laboratory results are within normal limits today.  Please also purchase Dulcolax and take daily, this might produce cramping.   You may continue miralax twice a day to help with your  Chronic constipation.   I have provided the number to the [facility] health and wellness clinic, please schedule an appointment in order to establish primary care along with recurrent follow-up for your chronic constipation.

## 2019-09-17 ENCOUNTER — Emergency Department (HOSPITAL_COMMUNITY)
Admission: EM | Admit: 2019-09-17 | Discharge: 2019-09-18 | Disposition: A | Discharge status: Home / Self Care | Payer: Medicaid Other | Attending: Emergency Medicine | Admitting: Emergency Medicine

## 2019-09-17 ENCOUNTER — Encounter (HOSPITAL_COMMUNITY): Payer: Self-pay

## 2019-09-17 DIAGNOSIS — K5904 Chronic idiopathic constipation: Secondary | ICD-10-CM | POA: Insufficient documentation

## 2019-09-17 MED ORDER — SODIUM CHLORIDE 0.9% FLUSH: 3.0000 mL | Freq: Once | INTRAVENOUS | Status: DC

## 2019-09-17 NOTE — ED Triage Notes (Signed)
Pt states that he has been having constipation for that past three months, last BM yesterday. Denies n/v/fevers.

## 2019-09-18 LAB — CBC
HCT: 40.7 % (ref 39.0–52.0)
Hemoglobin: 13 g/dL (ref 13.0–17.0)
MCH: 27.8 pg (ref 26.0–34.0)
MCHC: 31.9 g/dL (ref 30.0–36.0)
MCV: 87.2 fL (ref 80.0–100.0)
Platelets: 282 10*3/uL (ref 150–400)
RBC: 4.67 MIL/uL (ref 4.22–5.81)
RDW: 13.6 % (ref 11.5–15.5)
WBC: 4.4 10*3/uL (ref 4.0–10.5)
nRBC: 0 % (ref 0.0–0.2)

## 2019-09-18 LAB — URINALYSIS, ROUTINE W REFLEX MICROSCOPIC
Bilirubin Urine: NEGATIVE
Glucose, UA: NEGATIVE mg/dL
Hgb urine dipstick: NEGATIVE
Ketones, ur: 20 mg/dL — AB
Nitrite: NEGATIVE
Protein, ur: NEGATIVE mg/dL
Specific Gravity, Urine: 1.029 (ref 1.005–1.030)
pH: 5 (ref 5.0–8.0)

## 2019-09-18 LAB — COMPREHENSIVE METABOLIC PANEL
ALT: 14 U/L (ref 0–44)
AST: 15 U/L (ref 15–41)
Albumin: 4.4 g/dL (ref 3.5–5.0)
Alkaline Phosphatase: 53 U/L (ref 38–126)
Anion gap: 9 (ref 5–15)
BUN: 12 mg/dL (ref 6–20)
CO2: 24 mmol/L (ref 22–32)
Calcium: 9.3 mg/dL (ref 8.9–10.3)
Chloride: 110 mmol/L (ref 98–111)
Creatinine, Ser: 1.01 mg/dL (ref 0.61–1.24)
GFR calc Af Amer: >60 mL/min (ref >60)
GFR calc non Af Amer: >60 mL/min (ref >60)
Glucose, Bld: 103 mg/dL — ABNORMAL HIGH (ref 70–99)
Potassium: 3.8 mmol/L (ref 3.5–5.1)
Sodium: 143 mmol/L (ref 135–145)
Total Bilirubin: 1 mg/dL (ref 0.3–1.2)
Total Protein: 7.2 g/dL (ref 6.5–8.1)

## 2019-09-18 LAB — LIPASE, BLOOD: Lipase: 31 U/L (ref 11–51)

## 2019-09-18 NOTE — Discharge Instructions (Addendum)
Saw the ER for constipation.  Please read the instructions provided.  Take MiraLAX only when your symptoms are severe.  You need to see a GI doctor or primary care doctor if your symptoms continue long-term.  Emergency room is for emergencies.  As discussed, we are not primary care, and we are not the best doctors to manage chronic conditions like constipation, blood pressure, diabetes.  CALL [facility] FOR A PRIMARY CARE FOLLOW UP.

## 2019-09-18 NOTE — ED Provider Notes (Signed)
[facility] EMERGENCY DEPARTMENT Provider Note   CSN: [id number] Arrival date & time: 09/17/19  2353     History Chief Complaint  Patient presents with  . Constipation  . Abdominal Pain    Randy Fry is a 34 y.o. male.  HPI    34 year old male comes in with chief complaint of constipation and abdominal pain.  He has history of paranoid schizophrenia.  Patient reports that he has been taking MiraLAX but continues to have episodes of constipation.  His current episode has been going on for the last few days.  He has to strain heavily to have a BM.  Patient has not seen a PCP.  He states that the ER is his primary care. There is no family history of ulcerative colitis, IBD.  Patient denies any substance abuse.  Past Medical History:  Diagnosis Date  . Constipation   . Paranoid schizophrenia [facility])     Patient Active Problem List   Diagnosis Date Noted  . Diffuse abdominal pain 02/05/2017  . High anion gap metabolic acidosis 02/05/2017  . Paranoid schizophrenia (HCC) 04/28/2016    History reviewed. No pertinent surgical history.     No family history on file.  Social History   Tobacco Use  . Smoking status: Never Smoker  . Smokeless tobacco: Never Used  Vaping Use  . Vaping Use: Never used  Substance Use Topics  . Alcohol use: No  . Drug use: No    Home Medications Prior to Admission medications   Not on File    Allergies    Patient has no known allergies.  Review of Systems   Review of Systems  Constitutional: Positive for activity change.  Gastrointestinal: Positive for constipation. Negative for blood in stool.    Physical Exam Updated Vital Signs BP 120/76 (BP Location: Right Arm)   Pulse 60   Temp 98.6 F (37 C) (Oral)   Resp 20   SpO2 100%   Physical Exam Vitals and nursing note reviewed.  Constitutional:      Appearance: He is well-developed.  HENT:     Head: Atraumatic.  Cardiovascular:     Rate and Rhythm:  Normal rate.  Pulmonary:     Effort: Pulmonary effort is normal.  Abdominal:     Tenderness: There is no abdominal tenderness.     Hernia: No hernia is present.  Musculoskeletal:     Cervical back: Neck supple.  Skin:    General: Skin is warm.  Neurological:     Mental Status: He is alert and oriented to person, place, and time.     ED Results / Procedures / Treatments   Labs (all labs ordered are listed, but only abnormal results are displayed) Labs Reviewed  COMPREHENSIVE METABOLIC PANEL - Abnormal; Notable for the following components:      Result Value   Glucose, Bld 103 (*)    All other components within normal limits  URINALYSIS, ROUTINE W REFLEX MICROSCOPIC - Abnormal; Notable for the following components:   APPearance HAZY (*)    Ketones, ur 20 (*)    Leukocytes,Ua SMALL (*)    Bacteria, UA RARE (*)    All other components within normal limits  LIPASE, BLOOD  CBC    EKG None  Radiology No results found.  Procedures Procedures (including critical care time)  Medications Ordered in ED Medications  sodium chloride flush (NS) 0.9 % injection 3 mL (0 mLs Intravenous Hold 09/18/19 1660)    ED Course  I have reviewed the triage vital signs and the nursing notes.  Pertinent labs & imaging results that were available during my care of the patient were reviewed by me and considered in my medical decision making (see chart for details).    MDM Rules/Calculators/A&P                          34 year old comes in a chief complaint of constipation.  He has not had a proper BM in the last several months.  He has been taking MiraLAX and having " not normal-looking" BM.  He denies any bloody stools.  No abdominal pain.  Abdomen is soft and nondistended.  It appears that he has been to the ED frequently for constipation related issues.  We will encouraged increased fiber intake.  Final Clinical Impression(s) / ED Diagnoses Final diagnoses:  Chronic idiopathic  constipation    Rx / DC Orders ED Discharge Orders    None       Derwood Kaplan, MD 09/18/19 1017

## 2019-09-18 NOTE — ED Notes (Signed)
Patient verbalizes understanding of discharge instructions. Opportunity for questioning and answers were provided. Armband removed by staff, pt discharged from ED to home. Ambulatory, a&ox4

## 2019-11-25 ENCOUNTER — Emergency Department (HOSPITAL_COMMUNITY)
Admission: EM | Admit: 2019-11-25 | Discharge: 2019-11-26 | Disposition: A | Discharge status: Home / Self Care | Payer: Self-pay | Attending: Emergency Medicine | Admitting: Emergency Medicine

## 2019-11-25 ENCOUNTER — Other Ambulatory Visit: Payer: Self-pay

## 2019-11-25 ENCOUNTER — Encounter (HOSPITAL_COMMUNITY): Payer: Self-pay

## 2019-11-25 DIAGNOSIS — L853 Xerosis cutis: Secondary | ICD-10-CM | POA: Insufficient documentation

## 2019-11-25 DIAGNOSIS — K5900 Constipation, unspecified: Secondary | ICD-10-CM | POA: Insufficient documentation

## 2019-11-25 NOTE — ED Triage Notes (Signed)
Pt reports that he has been constipated for the past 2 weeks and has not had a BM and out of his stool softeners.

## 2019-11-26 ENCOUNTER — Encounter (HOSPITAL_COMMUNITY): Payer: Self-pay

## 2019-11-26 ENCOUNTER — Other Ambulatory Visit: Payer: Self-pay

## 2019-11-26 DIAGNOSIS — Z0000 Encounter for general adult medical examination without abnormal findings: Secondary | ICD-10-CM | POA: Insufficient documentation

## 2019-11-26 LAB — CBC
HCT: 45.2 % (ref 39.0–52.0)
Hemoglobin: 14.3 g/dL (ref 13.0–17.0)
MCH: 27.2 pg (ref 26.0–34.0)
MCHC: 31.6 g/dL (ref 30.0–36.0)
MCV: 85.9 fL (ref 80.0–100.0)
Platelets: 303 10*3/uL (ref 150–400)
RBC: 5.26 MIL/uL (ref 4.22–5.81)
RDW: 13.4 % (ref 11.5–15.5)
WBC: 3 10*3/uL — ABNORMAL LOW (ref 4.0–10.5)
nRBC: 0 % (ref 0.0–0.2)

## 2019-11-26 LAB — COMPREHENSIVE METABOLIC PANEL
ALT: 14 U/L (ref 0–44)
AST: 15 U/L (ref 15–41)
Albumin: 4.6 g/dL (ref 3.5–5.0)
Alkaline Phosphatase: 55 U/L (ref 38–126)
Anion gap: 14 (ref 5–15)
BUN: 15 mg/dL (ref 6–20)
CO2: 22 mmol/L (ref 22–32)
Calcium: 9.4 mg/dL (ref 8.9–10.3)
Chloride: 105 mmol/L (ref 98–111)
Creatinine, Ser: 1.22 mg/dL (ref 0.61–1.24)
Glucose, Bld: 114 mg/dL — ABNORMAL HIGH (ref 70–99)
Potassium: 3.8 mmol/L (ref 3.5–5.1)
Sodium: 141 mmol/L (ref 135–145)
Total Bilirubin: 0.9 mg/dL (ref 0.3–1.2)
Total Protein: 7.9 g/dL (ref 6.5–8.1)

## 2019-11-26 LAB — LIPASE, BLOOD: Lipase: 27 U/L (ref 11–51)

## 2019-11-26 MED ORDER — POLYETHYLENE GLYCOL 3350 17 G PO PACK: 17.0000 g | PACK | Freq: Every day | ORAL | 0 refills | Status: DC

## 2019-11-26 NOTE — Discharge Instructions (Addendum)
Thank you for allowing me to care for you today in the Emergency Department.   Drinking water is important to help prevent constipation.  You should drink at least 8 glasses of water every day.  You should also eat foods that are higher in fiber, which includes vegetables, to help prevent constipation.  Take 1 dose of MiraLAX daily as prescribed.  I would recommend getting established with a primary care doctor to see you in the office for your constipation.  You can apply lotion to your left ankle as needed for dry itchy skin.  Return the emergency department if you develop severe abdominal pain, uncontrollable vomiting, high fevers, if you stop producing urine, or develop other new, concerning symptoms.

## 2019-11-26 NOTE — ED Triage Notes (Signed)
Pt arrives EMS with request for flu vaccine and to receive fluids.

## 2019-11-26 NOTE — ED Provider Notes (Signed)
[facility] EMERGENCY DEPARTMENT Provider Note   CSN: [id number] Arrival date & time: 11/25/19  2304     History Chief Complaint  Patient presents with  . Constipation    Randy Fry is a 34 y.o. male with a history of paranoid schizophrenia and constipation who presents to the emergency department with a chief complaint of constipation.  This is the patient's second visit for constipation in the last 6 months.  He has greater than 10 visits to the ER for the same over the last couple of years, and returns to the emergency department for a refill of MiraLAX when the prescription runs out.  Reports that he has not had a bowel movement in 2 weeks, which is around the time that he ran out of MiraLAX.  He has been passing flatus.  Reportedly is only drinking 1 glass of water per day, but is requesting water at this time as well as something to eat.  He is unable to elaborate on his diet, but denies recent changes in his diet.  He denies fever, chills, abdominal pain, nausea, vomiting, diarrhea, or genitourinary complaints.  No history of abdominal surgery.  No history of bowel obstruction.  He is not established with a PCP.  Patient also reports that he would like some cream for dry skin around his left ankle.  The history is provided by the patient and medical records. No language interpreter was used.       Past Medical History:  Diagnosis Date  . Constipation   . Paranoid schizophrenia [facility])     Patient Active Problem List   Diagnosis Date Noted  . Diffuse abdominal pain 02/05/2017  . High anion gap metabolic acidosis 02/05/2017  . Paranoid schizophrenia (HCC) 04/28/2016    History reviewed. No pertinent surgical history.     No family history on file.  Social History   Tobacco Use  . Smoking status: Never Smoker  . Smokeless tobacco: Never Used  Vaping Use  . Vaping Use: Never used  Substance Use Topics  . Alcohol use: No  . Drug use: No     Home Medications Prior to Admission medications   Medication Sig Start Date End Date Taking? Authorizing Provider  polyethylene glycol (MIRALAX / GLYCOLAX) 17 g packet Take 17 g by mouth daily. 11/26/19   Klair Leising A, PA-C    Allergies    Patient has no known allergies.  Review of Systems   Review of Systems  Constitutional: Negative for appetite change, chills and fever.  HENT: Negative for congestion.   Eyes: Negative for visual disturbance.  Respiratory: Negative for shortness of breath.   Cardiovascular: Negative for chest pain.  Gastrointestinal: Positive for constipation. Negative for abdominal pain, blood in stool, diarrhea, nausea and vomiting.  Genitourinary: Negative for dysuria, frequency and urgency.  Musculoskeletal: Negative for back pain, myalgias, neck pain and neck stiffness.  Skin: Negative for rash.  Allergic/Immunologic: Negative for immunocompromised state.  Neurological: Negative for dizziness, seizures, syncope, weakness and headaches.  Psychiatric/Behavioral: Negative for confusion.    Physical Exam Updated Vital Signs BP 126/82 (BP Location: Left Arm)   Pulse 95   Temp 98.4 F (36.9 C) (Oral)   Resp 18   SpO2 100%   Physical Exam Vitals and nursing note reviewed.  Constitutional:      Appearance: He is well-developed.     Comments: No acute distress.  Patient has a washcloth covering his mouth and nose that is located under his  surgical mask.   HENT:     Head: Normocephalic.  Eyes:     Conjunctiva/sclera: Conjunctivae normal.  Cardiovascular:     Rate and Rhythm: Normal rate and regular rhythm.     Heart sounds: No murmur heard.   Pulmonary:     Effort: Pulmonary effort is normal. No respiratory distress.     Breath sounds: No stridor. No wheezing, rhonchi or rales.  Chest:     Chest wall: No tenderness.  Abdominal:     General: There is no distension.     Palpations: Abdomen is soft. There is no mass.     Tenderness: There is  no abdominal tenderness. There is no right CVA tenderness, left CVA tenderness, guarding or rebound.     Hernia: No hernia is present.     Comments: Abdomen is soft, nontender, nondistended.  Normoactive bowel sounds in all 4 quadrants.  There is no rebound or guarding.  No tenderness over McBurney's point.  No CVA tenderness bilaterally.  Negative Murphy sign.  Negative Rovsing sign.  Musculoskeletal:     Cervical back: Neck supple.     Comments: Dry scaling skin noted to the left ankle.  Ankle is nontender with full active and passive range of motion.  There well-healing wounds noted around the sock line that are suggestive of healing insect bites.  No redness, warmth, edema, or induration.  Skin:    General: Skin is warm and dry.  Neurological:     Mental Status: He is alert.  Psychiatric:        Mood and Affect: Affect is flat.        Behavior: Behavior normal. Behavior is cooperative.     ED Results / Procedures / Treatments   Labs (all labs ordered are listed, but only abnormal results are displayed) Labs Reviewed  COMPREHENSIVE METABOLIC PANEL - Abnormal; Notable for the following components:      Result Value   Glucose, Bld 114 (*)    All other components within normal limits  CBC - Abnormal; Notable for the following components:   WBC 3.0 (*)    All other components within normal limits  LIPASE, BLOOD  URINALYSIS, ROUTINE W REFLEX MICROSCOPIC    EKG None  Radiology No results found.  Procedures Procedures (including critical care time)  Medications Ordered in ED Medications - No data to display  ED Course  I have reviewed the triage vital signs and the nursing notes.  Pertinent labs & imaging results that were available during my care of the patient were reviewed by me and considered in my medical decision making (see chart for details).    MDM Rules/Calculators/A&P                          34 year old male with a history of paranoid schizophrenia and  constipation presenting with constipation for the last 2 weeks.  This is the patient's second visit for constipation the last 6 months and has been seen many times over the last few years for similar presentation.  Vital signs are normal.  His abdominal exam is benign.  I have a very low suspicion for fecal impaction or bowel obstruction.  Doubt diverticulitis.  His labs have been reviewed and are unremarkable.    He is actively requesting water and something to eat, which has been provided.  He has also been given lotion for dry skin on his left ankle.  We will refill his home  MiraLAX.  Patient has been counseled on high-fiber diet and increasing his water intake at home.  He is also been counseled on getting established with a primary care clinician.  At this time, feel that no further urgent or emergent work-up is indicated.  ER return precautions given.  Safe for discharge to home with outpatient follow-up as needed.  Final Clinical Impression(s) / ED Diagnoses Final diagnoses:  Constipation, unspecified constipation type  Dry skin    Rx / DC Orders ED Discharge Orders         Ordered    polyethylene glycol (MIRALAX / GLYCOLAX) 17 g packet  Daily        11/26/19 0633           Barkley Boards, PA-C 11/26/19 0650    Dione Booze, MD 11/26/19 0700

## 2019-11-27 ENCOUNTER — Emergency Department (HOSPITAL_COMMUNITY)
Admission: EM | Admit: 2019-11-27 | Discharge: 2019-11-27 | Disposition: A | Discharge status: Home / Self Care | Payer: Medicaid Other | Attending: Emergency Medicine | Admitting: Emergency Medicine

## 2019-11-27 DIAGNOSIS — Z0000 Encounter for general adult medical examination without abnormal findings: Secondary | ICD-10-CM

## 2019-11-27 NOTE — ED Provider Notes (Signed)
[facility]   CSN: [id number] Arrival date & time: 11/26/19  2054     History No chief complaint on file.   Randy Fry is a 34 y.o. male.  34 year old male with history of paranoid schizophrenia presents requesting a flu vaccine.  He denies any recent URI symptoms.  Does endorse constipation without emesis.  Was seen yesterday for similar symptoms.  No urinary issues.  States he is also hungry        Past Medical History:  Diagnosis Date  . Constipation   . Paranoid schizophrenia [facility])     Patient Active Problem List   Diagnosis Date Noted  . Diffuse abdominal pain 02/05/2017  . High anion gap metabolic acidosis 02/05/2017  . Paranoid schizophrenia (HCC) 04/28/2016    History reviewed. No pertinent surgical history.     No family history on file.  Social History   Tobacco Use  . Smoking status: Never Smoker  . Smokeless tobacco: Never Used  Vaping Use  . Vaping Use: Never used  Substance Use Topics  . Alcohol use: No  . Drug use: No    Home Medications Prior to Admission medications   Medication Sig Start Date End Date Taking? Authorizing Provider  polyethylene glycol (MIRALAX / GLYCOLAX) 17 g packet Take 17 g by mouth daily. 11/26/19   McDonald, Mia A, PA-C    Allergies    Patient has no known allergies.  Review of Systems   Review of Systems  All other systems reviewed and are negative.   Physical Exam Updated Vital Signs BP 129/81   Pulse (!) 109   Temp 98.4 F (36.9 C) (Oral)   Resp 18   Ht 1.727 m (5\' 8" )   Wt 68 kg   SpO2 100%   BMI 22.79 kg/m   Physical Exam Vitals and nursing note reviewed.  Constitutional:      General: He is not in acute distress.    Appearance: Normal appearance. He is well-developed. He is not toxic-appearing.  HENT:     Head: Normocephalic and atraumatic.  Eyes:     General: Lids are normal.     Conjunctiva/sclera: Conjunctivae normal.     Pupils:  Pupils are equal, round, and reactive to light.  Neck:     Thyroid: No thyroid mass.     Trachea: No tracheal deviation.  Cardiovascular:     Rate and Rhythm: Normal rate and regular rhythm.     Heart sounds: Normal heart sounds. No murmur heard.  No gallop.   Pulmonary:     Effort: Pulmonary effort is normal. No respiratory distress.     Breath sounds: Normal breath sounds. No stridor. No decreased breath sounds, wheezing, rhonchi or rales.  Abdominal:     General: Bowel sounds are normal. There is no distension.     Palpations: Abdomen is soft.     Tenderness: There is no abdominal tenderness. There is no rebound.  Musculoskeletal:        General: No tenderness. Normal range of motion.     Cervical back: Normal range of motion and neck supple.  Skin:    General: Skin is warm and dry.     Findings: No abrasion or rash.  Neurological:     Mental Status: He is alert and oriented to person, place, and time.     GCS: GCS eye subscore is 4. GCS verbal subscore is 5. GCS motor subscore is 6.     Cranial  Nerves: No cranial nerve deficit.     Sensory: No sensory deficit.  Psychiatric:        Attention and Perception: Attention normal.        Speech: Speech normal.        Behavior: Behavior is cooperative.     ED Results / Procedures / Treatments   Labs (all labs ordered are listed, but only abnormal results are displayed) Labs Reviewed - No data to display  EKG None  Radiology No results found.  Procedures Procedures (including critical care time)  Medications Ordered in ED Medications - No data to display  ED Course  I have reviewed the triage vital signs and the nursing notes.  Pertinent labs & imaging results that were available during my care of the patient were reviewed by me and considered in my medical decision making (see chart for details).    MDM Rules/Calculators/A&P                         Patient informed that we do not do flu vaccines here.  Was given  some to eat and drink.  Encouraged to follow-up with his doctor as needed Final Clinical Impression(s) / ED Diagnoses Final diagnoses:  None    Rx / DC Orders ED Discharge Orders    None       Lorre Nick, MD 11/27/19 1013

## 2020-02-02 ENCOUNTER — Emergency Department (HOSPITAL_COMMUNITY)
Admission: EM | Admit: 2020-02-02 | Discharge: 2020-02-02 | Disposition: A | Discharge status: Home / Self Care | Payer: Medicaid Other | Attending: Emergency Medicine | Admitting: Emergency Medicine

## 2020-02-02 ENCOUNTER — Encounter (HOSPITAL_COMMUNITY): Payer: Self-pay | Admitting: Emergency Medicine

## 2020-02-02 ENCOUNTER — Other Ambulatory Visit: Payer: Self-pay

## 2020-02-02 DIAGNOSIS — Z5321 Procedure and treatment not carried out due to patient leaving prior to being seen by health care provider: Secondary | ICD-10-CM | POA: Insufficient documentation

## 2020-02-02 DIAGNOSIS — K6289 Other specified diseases of anus and rectum: Secondary | ICD-10-CM | POA: Insufficient documentation

## 2020-02-02 NOTE — ED Triage Notes (Signed)
C/o anal pain and swelling x 2 days.

## 2020-02-02 NOTE — ED Notes (Signed)
No answer for vitals x2 

## 2020-02-03 ENCOUNTER — Emergency Department (HOSPITAL_COMMUNITY)
Admission: EM | Admit: 2020-02-03 | Discharge: 2020-02-03 | Disposition: A | Discharge status: Home / Self Care | Payer: Self-pay | Attending: Emergency Medicine | Admitting: Emergency Medicine

## 2020-02-03 ENCOUNTER — Other Ambulatory Visit: Payer: Self-pay

## 2020-02-03 ENCOUNTER — Encounter (HOSPITAL_COMMUNITY): Payer: Self-pay | Admitting: Emergency Medicine

## 2020-02-03 DIAGNOSIS — K644 Residual hemorrhoidal skin tags: Secondary | ICD-10-CM

## 2020-02-03 DIAGNOSIS — N3091 Cystitis, unspecified with hematuria: Secondary | ICD-10-CM | POA: Insufficient documentation

## 2020-02-03 DIAGNOSIS — K649 Unspecified hemorrhoids: Secondary | ICD-10-CM | POA: Insufficient documentation

## 2020-02-03 DIAGNOSIS — N3001 Acute cystitis with hematuria: Secondary | ICD-10-CM

## 2020-02-03 LAB — URINALYSIS, ROUTINE W REFLEX MICROSCOPIC
Bilirubin Urine: NEGATIVE
Glucose, UA: NEGATIVE mg/dL
Hgb urine dipstick: NEGATIVE
Ketones, ur: 5 mg/dL — AB
Nitrite: NEGATIVE
Protein, ur: 30 mg/dL — AB
Specific Gravity, Urine: 1.029 (ref 1.005–1.030)
pH: 5 (ref 5.0–8.0)

## 2020-02-03 MED ORDER — CEPHALEXIN 500 MG PO CAPS: 500.0000 mg | ORAL_CAPSULE | Freq: Two times a day (BID) | ORAL | 0 refills | Status: AC

## 2020-02-03 MED ORDER — HYDROCORTISONE (PERIANAL) 2.5 % EX CREA
1.0000 | TOPICAL_CREAM | Freq: Two times a day (BID) | CUTANEOUS | 0 refills | Status: AC
Start: 2020-02-03 — End: 2020-02-10

## 2020-02-03 NOTE — Care Management (Signed)
  [facility] Medication Assistance Card Name: Oluwafemi Villella ID (MRN): [id number] Bin: [id number] RX Group: [id number] Discharge Date: 02/03/2020 Expiration Date: 02/15/2020                                           (must be filled within 7 days of discharge)       Dear   : Randy Fry  You have been approved to have the prescriptions written by your discharging physician filled through our [facility] (Medication Assistance Through [facility]) program. This program allows for a one-time (no refills) 34-day supply of selected medications for a low copay amount.  The copay is $3.00 per prescription. For instance, if you have one prescription, you will pay $3.00; for two prescriptions, you pay $6.00; for three prescriptions, you pay $9.00; and so on.  Only certain pharmacies are participating in this program with [facility]. You will need to select one of the pharmacies from the attached list and take your prescriptions, this letter, and your photo ID to one of the participating pharmacies.   We are excited that you are able to use the [facility] program to get your medications. These prescriptions must be filled within 7 days of hospital discharge or they will no longer be valid for the [facility] program. Should you have any problems with your prescriptions please contact your case management team member at [phone] for [place]/ [facility].  Thank you, [facility] Health Care Management

## 2020-02-03 NOTE — Discharge Instructions (Addendum)
seen here for rectal pain.  Exam shows that you have external hemorrhoids.  Start you on steroids please apply to the affected area twice a day for the next 7 days.  I recommend MiraLAX please take this once a day for the next 2 weeks, in order for this to work, you needs to stay hydrated please drink plenty of fluids.  I have also given you information on dietary habits to help prevent constipation.  You may also do sits baths as this will help relieve your pain.  Concerned he may have a UTI will start on antibiotics please use as prescribed.   Recommend a follow-up with general surgery for further evaluation management of your hemorrhoids.  Come back to the emergency department if you develop chest pain, shortness of breath, severe abdominal pain, uncontrolled nausea, vomiting, diarrhea.

## 2020-02-03 NOTE — ED Provider Notes (Signed)
[facility] EMERGENCY DEPARTMENT Provider Note   CSN: [id number] Arrival date & time: 02/03/20  2993     History Chief Complaint  Patient presents with  . Rectal Pain    Randy Fry is a 34 y.o. male.  HPI   Patient with significant medical history of constipation, schizophrenia presents to the emergency department with chief complaint of rectal pain.  Patient states pain started about 3 days ago, describes as a constant throbbing sensation.  Straining, bowel movements, walking, sitting tends to make the pain worse.  He endorses swelling around his rectum, denies recent trauma to the area denies ever experienced this pain in the past.  He does endorse that his last bowel movement was 1 week ago, he denies dark tarry stools, seeing blood in his stools, having history of hemorrhoids.  He endorses that he has been taking MiraLAX as needed which does not seem to help with his constipation.  Patient also indicates that he is has difficulty with urination, states it is hard to get it out, denies hematuria, dysuria, urinary frequency, flank pains, testicular pain, penile discharge.  He states this happened about 3 days ago he denies any alleviating factors.   Patient denies headaches, fevers, chills, shortness of breath, chest pain, abdominal pain, nausea, vomiting, diarrhea, pedal edema.  Past Medical History:  Diagnosis Date  . Constipation   . Paranoid schizophrenia [facility])     Patient Active Problem List   Diagnosis Date Noted  . Diffuse abdominal pain 02/05/2017  . High anion gap metabolic acidosis 02/05/2017  . Paranoid schizophrenia (HCC) 04/28/2016    History reviewed. No pertinent surgical history.     No family history on file.  Social History   Tobacco Use  . Smoking status: Never Smoker  . Smokeless tobacco: Never Used  Vaping Use  . Vaping Use: Never used  Substance Use Topics  . Alcohol use: No  . Drug use: No    Home Medications Prior to  Admission medications   Medication Sig Start Date End Date Taking? Authorizing Provider  cephALEXin (KEFLEX) 500 MG capsule Take 1 capsule (500 mg total) by mouth 2 (two) times daily for 5 days. 02/03/20 02/08/20  Carroll Sage, PA-C  hydrocortisone (ANUSOL-HC) 2.5 % rectal cream Place 1 application rectally 2 (two) times daily for 7 days. 02/03/20 02/10/20  Carroll Sage, PA-C  polyethylene glycol (MIRALAX / GLYCOLAX) 17 g packet Take 17 g by mouth daily. 11/26/19   McDonald, Mia A, PA-C    Allergies    Patient has no known allergies.  Review of Systems   Review of Systems  Constitutional: Negative for chills and fever.  HENT: Negative for congestion.   Respiratory: Negative for shortness of breath.   Cardiovascular: Negative for chest pain.  Gastrointestinal: Positive for constipation and rectal pain. Negative for abdominal pain, anal bleeding, blood in stool, diarrhea, nausea and vomiting.  Genitourinary: Positive for difficulty urinating. Negative for discharge, dysuria, enuresis, flank pain, frequency and penile swelling.  Musculoskeletal: Negative for back pain.  Skin: Negative for rash.  Neurological: Negative for dizziness and headaches.  Hematological: Does not bruise/bleed easily.    Physical Exam Updated Vital Signs BP 118/76 (BP Location: Right Arm)   Pulse 91   Temp 98.5 F (36.9 C) (Oral)   Resp 18   Ht 5\' 8"  (1.727 m)   Wt 72 kg   SpO2 100%   BMI 24.14 kg/m   Physical Exam Vitals and nursing note reviewed.  Exam conducted with a chaperone present.  Constitutional:      General: He is not in acute distress.    Appearance: He is not ill-appearing.  HENT:     Head: Normocephalic and atraumatic.     Nose: No congestion.  Eyes:     Conjunctiva/sclera: Conjunctivae normal.  Cardiovascular:     Rate and Rhythm: Normal rate and regular rhythm.     Pulses: Normal pulses.     Heart sounds: No murmur heard.  No friction rub. No gallop.   Pulmonary:      Effort: No respiratory distress.     Breath sounds: No wheezing, rhonchi or rales.  Abdominal:     Palpations: Abdomen is soft.     Tenderness: There is no right CVA tenderness, left CVA tenderness or guarding.  Genitourinary:    Comments: With a chaperone present rectum was evaluated, there was multiple small external hemorrhoids noted, they were not erythematous, did not appear ischemic, they were soft to the touch, no signs of thrombosed hemorrhoid noted. Musculoskeletal:     Right lower leg: No edema.     Left lower leg: No edema.  Skin:    General: Skin is warm and dry.  Neurological:     Mental Status: He is alert.  Psychiatric:        Mood and Affect: Mood normal.     ED Results / Procedures / Treatments   Labs (all labs ordered are listed, but only abnormal results are displayed) Labs Reviewed  URINALYSIS, ROUTINE W REFLEX MICROSCOPIC - Abnormal; Notable for the following components:      Result Value   Color, Urine AMBER (*)    APPearance CLOUDY (*)    Ketones, ur 5 (*)    Protein, ur 30 (*)    Leukocytes,Ua MODERATE (*)    Bacteria, UA RARE (*)    All other components within normal limits  URINE CULTURE  POC OCCULT BLOOD, ED    EKG None  Radiology No results found.  Procedures Procedures (including critical care time)  Medications Ordered in ED Medications - No data to display  ED Course  I have reviewed the triage vital signs and the nursing notes.  Pertinent labs & imaging results that were available during my care of the patient were reviewed by me and considered in my medical decision making (see chart for details).    MDM Rules/Calculators/A&P                          Patient presents with rectal pain, and difficulty with urination.  He was alert, does not appear in acute distress, vital signs reassuring.  Will obtain UA and perform rectal exam for further evaluation.  UA shows moderate leukocytes, mild red blood cells, moderate white blood  cells, rare bacteria.  Due to abnormal UA will obtain cultures for further evaluation.  Low suspicion for pyelonephritis as patient denies abdominal pain, flank pain, no CVA tenderness noted my exam, vital signs reassuring. Low suspicion for intra-abdominal abnormality requiring immediate intervention as abdomen was soft nontender to palpation, no acute abdomen on exam, patient is tolerating p.o. without difficulty.  UA is abnormal and patient endorses difficulty with urination will culture urine and start him on antibiotics for possible UTI.  Low suspicion for systemic infection as patient is nontoxic-appearing, vital signs reassuring, no obvious source infection on my exam.  Low suspicion for prolapse internal hemorrhoid as masses were noted on the  external aspect of patient's rectum, no bulging mass coming from the rectum.  Low suspicion that these hemorrhoids are thrombosed as they were non ischemic-looking, soft to the touch, was not extremely tender upon palpation.  Suspect patient suffering from external hemorrhoids most likely secondary to constipation.  Will start him on topical steroids, MiraLAX regiment, sitz bath's and have him follow-up with general surgery for further evaluation.  Vital signs have remained stable, no indication for hospital admission. Patient given at home care as well strict return precautions.  Patient verbalized that they understood agreed to said plan.  Final Clinical Impression(s) / ED Diagnoses Final diagnoses:  External hemorrhoid  Acute cystitis with hematuria    Rx / DC Orders ED Discharge Orders         Ordered    hydrocortisone (ANUSOL-HC) 2.5 % rectal cream  2 times daily        02/03/20 0815    cephALEXin (KEFLEX) 500 MG capsule  2 times daily        02/03/20 0859           Carroll Sage, PA-C 02/03/20 2458    Eber Hong, MD 02/05/20 0800

## 2020-02-03 NOTE — ED Triage Notes (Signed)
Patient reports rectal pain for 3 days , denies injury or bleeding.

## 2020-02-04 LAB — URINE CULTURE: Culture: NO GROWTH

## 2020-09-05 ENCOUNTER — Emergency Department (HOSPITAL_COMMUNITY)
Admission: EM | Admit: 2020-09-05 | Discharge: 2020-09-06 | Disposition: A | Discharge status: Home / Self Care | Payer: Medicaid Other | Attending: Emergency Medicine | Admitting: Emergency Medicine

## 2020-09-05 ENCOUNTER — Other Ambulatory Visit: Payer: Self-pay

## 2020-09-05 ENCOUNTER — Encounter (HOSPITAL_COMMUNITY): Payer: Self-pay | Admitting: Emergency Medicine

## 2020-09-05 DIAGNOSIS — K5900 Constipation, unspecified: Secondary | ICD-10-CM | POA: Insufficient documentation

## 2020-09-05 DIAGNOSIS — R42 Dizziness and giddiness: Secondary | ICD-10-CM | POA: Insufficient documentation

## 2020-09-05 LAB — COMPREHENSIVE METABOLIC PANEL
ALT: 21 U/L (ref 0–44)
AST: 20 U/L (ref 15–41)
Albumin: 4.9 g/dL (ref 3.5–5.0)
Alkaline Phosphatase: 67 U/L (ref 38–126)
Anion gap: 8 (ref 5–15)
BUN: 14 mg/dL (ref 6–20)
CO2: 20 mmol/L — ABNORMAL LOW (ref 22–32)
Calcium: 10.1 mg/dL (ref 8.9–10.3)
Chloride: 115 mmol/L — ABNORMAL HIGH (ref 98–111)
Creatinine, Ser: 1.22 mg/dL (ref 0.61–1.24)
GFR, Estimated: >60 mL/min (ref >60)
Glucose, Bld: 116 mg/dL — ABNORMAL HIGH (ref 70–99)
Potassium: 4.4 mmol/L (ref 3.5–5.1)
Sodium: 143 mmol/L (ref 135–145)
Total Bilirubin: 0.9 mg/dL (ref 0.3–1.2)
Total Protein: 8.3 g/dL — ABNORMAL HIGH (ref 6.5–8.1)

## 2020-09-05 LAB — CBC WITH DIFFERENTIAL/PLATELET
Abs Immature Granulocytes: 0.02 10*3/uL (ref 0.00–0.07)
Basophils Absolute: 0.1 10*3/uL (ref 0.0–0.1)
Basophils Relative: 1 %
Eosinophils Absolute: 0.1 10*3/uL (ref 0.0–0.5)
Eosinophils Relative: 3 %
HCT: 46.2 % (ref 39.0–52.0)
Hemoglobin: 15.3 g/dL (ref 13.0–17.0)
Immature Granulocytes: 0 %
Lymphocytes Relative: 37 %
Lymphs Abs: 1.7 10*3/uL (ref 0.7–4.0)
MCH: 28.1 pg (ref 26.0–34.0)
MCHC: 33.1 g/dL (ref 30.0–36.0)
MCV: 84.8 fL (ref 80.0–100.0)
Monocytes Absolute: 0.5 10*3/uL (ref 0.1–1.0)
Monocytes Relative: 11 %
Neutro Abs: 2.2 10*3/uL (ref 1.7–7.7)
Neutrophils Relative %: 48 %
Platelets: 285 10*3/uL (ref 150–400)
RBC: 5.45 MIL/uL (ref 4.22–5.81)
RDW: 14 % (ref 11.5–15.5)
WBC: 4.5 10*3/uL (ref 4.0–10.5)
nRBC: 0 % (ref 0.0–0.2)

## 2020-09-05 LAB — LIPASE, BLOOD: Lipase: 68 U/L — ABNORMAL HIGH (ref 11–51)

## 2020-09-05 NOTE — ED Triage Notes (Addendum)
Pt states coming in for follow up for constipation and new concerns of slight dizziness. Last BM today, denies pain. Pt states gait stable, no syncopal episodes. Requesting stomach cleanse. HR 125 in triage.

## 2020-09-05 NOTE — ED Provider Notes (Signed)
Emergency Medicine Provider Triage Evaluation Note  Ko Deer , a 35 y.o. male  was evaluated in triage.  Pt complains of intermittent dizziness. Also endorses constipation.  Denies any chest pain shortness of breath.  He has difficulty differentiating between lightheadedness and dizziness which seems that he is having more lightheaded like symptoms.  Denies any dark or tarry stools.  Review of Systems  Positive: LH/dizzy Negative: SOB  Physical Exam  BP (!) 112/96 (BP Location: Left Arm)   Pulse (!) 125   Temp 98.4 F (36.9 C)   Resp 20   Ht 5\' 8"  (1.727 m)   Wt 71.7 kg   SpO2 100%   BMI 24.02 kg/m  Gen:   Awake, no distress   Resp:  Normal effort  MSK:   Moves extremities without difficulty  Other:  Palpable tachycardia heart rate approximately 120.  Medical Decision Making  Medically screening exam initiated at 8:09 PM.  Appropriate orders placed.  Dereke Klaas was informed that the remainder of the evaluation will be completed by another provider, this initial triage assessment does not replace that evaluation, and the importance of remaining in the ED until their evaluation is complete.  Denies any recreational drug use chest pain or shortness of breath.  We will obtain basic labs given his tachycardia  Concern for dehydration patient's constipation and states that he has had some nausea vomiting yesterday   , Gailen Shelter 09/05/20 2017    2018, MD 09/05/20 2141

## 2020-09-06 NOTE — Discharge Instructions (Addendum)
Call your primary care doctor or specialist as discussed in the next 2-3 days.   Return immediately back to the ER if:  Your symptoms worsen within the next 12-24 hours. You develop new symptoms such as new fevers, persistent vomiting, new pain, shortness of breath, or new weakness or numbness, or if you have any other concerns.  

## 2020-09-06 NOTE — ED Notes (Signed)
Provider at bedside at this time

## 2020-09-06 NOTE — ED Provider Notes (Signed)
[facility] EMERGENCY DEPARTMENT Provider Note   CSN: [id number] Arrival date & time: 09/05/20  1847     History Chief Complaint  Patient presents with   Randy Fry   Randy Fry    Prem Doyon is a 35 y.o. male.  Complaining of episode of lightheadedness that occurred today.  Also states has been constipated for about a week or 2.  However he had a bowel movement this morning.  He has been taking MiraLAX off and on currently has not been taking it for few days because he states he ran out.  He denies fevers or cough.  Denies vomiting.  Denies abdominal pain.      Past Medical History:  Diagnosis Date   Randy Fry    Paranoid schizophrenia [facility])     Patient Active Problem List   Diagnosis Date Noted   Diffuse abdominal pain 02/05/2017   High anion gap metabolic acidosis 02/05/2017   Paranoid schizophrenia (HCC) 04/28/2016    History reviewed. No pertinent surgical history.     History reviewed. No pertinent family history.  Social History   Tobacco Use   Smoking status: Never   Smokeless tobacco: Never  Vaping Use   Vaping Use: Never used  Substance Use Topics   Alcohol use: No   Drug use: No    Home Medications Prior to Admission medications   Medication Sig Start Date End Date Taking? Authorizing Provider  polyethylene glycol (MIRALAX / GLYCOLAX) 17 g packet Take 17 g by mouth daily. 11/26/19  Yes McDonald, Mia A, PA-C    Allergies    Patient has no known allergies.  Review of Systems   Review of Systems  Constitutional:  Negative for fever.  HENT:  Negative for ear pain and sore throat.   Eyes:  Negative for pain.  Respiratory:  Negative for cough.   Cardiovascular:  Negative for chest pain.  Gastrointestinal:  Negative for abdominal pain.  Genitourinary:  Negative for flank pain.  Musculoskeletal:  Negative for back pain.  Skin:  Negative for color change and rash.  Neurological:  Negative for syncope.  All other  systems reviewed and are negative.  Physical Exam Updated Vital Signs BP 127/85 (BP Location: Right Arm)   Pulse (!) 109   Temp 98.7 F (37.1 C) (Oral)   Resp 20   Ht 5\' 8"  (1.727 m)   Wt 71.7 kg   SpO2 100%   BMI 24.02 kg/m   Physical Exam Constitutional:      General: He is not in acute distress.    Appearance: He is well-developed.  HENT:     Head: Normocephalic.     Nose: Nose normal.  Eyes:     Extraocular Movements: Extraocular movements intact.  Cardiovascular:     Rate and Rhythm: Normal rate.  Pulmonary:     Effort: Pulmonary effort is normal.  Abdominal:     General: There is no distension.     Tenderness: There is no abdominal tenderness.  Skin:    Coloration: Skin is not jaundiced.  Neurological:     Mental Status: He is alert. Mental status is at baseline.    ED Results / Procedures / Treatments   Labs (all labs ordered are listed, but only abnormal results are displayed) Labs Reviewed  COMPREHENSIVE METABOLIC PANEL - Abnormal; Notable for the following components:      Result Value   Chloride 115 (*)    CO2 20 (*)    Glucose, Bld 116 (*)  Total Protein 8.3 (*)    All other components within normal limits  LIPASE, BLOOD - Abnormal; Notable for the following components:   Lipase 68 (*)    All other components within normal limits  CBC WITH DIFFERENTIAL/PLATELET    EKG EKG Interpretation  Date/Time:  Friday September 05 2020 20:40:16 EDT Ventricular Rate:  95 PR Interval:  128 QRS Duration: 86 QT Interval:  330 QTC Calculation: 414 R Axis:   65 Text Interpretation: Normal sinus rhythm Normal ECG Confirmed by Norman Clay ([id number]) on 09/06/2020 1:15:42 AM  Radiology No results found.  Procedures Procedures   Medications Ordered in ED Medications - No data to display  ED Course  I have reviewed the triage vital signs and the nursing notes.  Pertinent labs & imaging results that were available during my care of the patient were reviewed  by me and considered in my medical decision making (see chart for details).    MDM Rules/Calculators/A&P                          Labs unremarkable.  White count 4 hemoglobin 15 chemistry normal.  Serial exam exams are done last exam done at 2:20 AM with no tenderness or guarding or rebound.  Patient states he had a bowel movement earlier today.  Clinically I doubt ileus or obstruction.  Advised MiraLAX daily and to increase it to twice daily as needed.  Advised immediate return if he has vomiting or pain or any additional concerns.  Final Clinical Impression(s) / ED Diagnoses Final diagnoses:  Randy Fry  Randy Fry, unspecified Randy Fry type    Rx / DC Orders ED Discharge Orders     None        Cheryll Cockayne, MD 09/06/20 [phone]

## 2021-07-16 ENCOUNTER — Emergency Department (HOSPITAL_COMMUNITY)
Admission: EM | Admit: 2021-07-16 | Discharge: 2021-07-16 | Disposition: A | Discharge status: Home / Self Care | Payer: Medicaid Other | Attending: Emergency Medicine | Admitting: Emergency Medicine

## 2021-07-16 ENCOUNTER — Encounter (HOSPITAL_COMMUNITY): Payer: Self-pay | Admitting: Emergency Medicine

## 2021-07-16 DIAGNOSIS — K5900 Constipation, unspecified: Secondary | ICD-10-CM | POA: Insufficient documentation

## 2021-07-16 DIAGNOSIS — Z760 Encounter for issue of repeat prescription: Secondary | ICD-10-CM | POA: Insufficient documentation

## 2021-07-16 MED ORDER — POLYETHYLENE GLYCOL 3350 17 G PO PACK: 17.0000 g | PACK | Freq: Every day | ORAL | 0 refills | Status: DC | PRN

## 2021-07-16 MED ORDER — HYDROCORTISONE ACETATE 25 MG RE SUPP: 25.0000 mg | Freq: Two times a day (BID) | RECTAL | 0 refills | Status: DC

## 2021-07-16 NOTE — ED Notes (Signed)
Pt not found in triage or lobby

## 2021-07-16 NOTE — ED Triage Notes (Signed)
Patient here with complaint of constipation over the last week. Patient reports less than normal volume of normal bowel movements during that time period. Patient reports taking laxatives with no improvement in constipation.

## 2021-07-16 NOTE — ED Provider Triage Note (Signed)
Emergency Medicine Provider Triage Evaluation Note  Randy Fry , a 36 y.o. male  was evaluated in triage.  Pt complains of constipation.  No bowel movement in 1 week.  Try taking MiraLAX.  Reports generalized abdominal pain associated with constipation.  No vomiting.  Review of Systems  Positive: Constipation Negative: Vomiting  Physical Exam  BP (!) 117/91 (BP Location: Right Arm)   Pulse 72   Temp 98.3 F (36.8 C)   Resp 16   SpO2 100%  Gen:   Awake, no distress   Resp:  Normal effort  MSK:   Moves extremities without difficulty  Other:  Abdomen is soft  Medical Decision Making  Medically screening exam initiated at 4:47 PM.  Appropriate orders placed.  Randy Fry was informed that the remainder of the evaluation will be completed by another provider, this initial triage assessment does not replace that evaluation, and the importance of remaining in the ED until their evaluation is complete.     Dietrich Pates, PA-C 07/16/21 1648

## 2021-07-16 NOTE — ED Provider Notes (Signed)
[facility] EMERGENCY DEPARTMENT Provider Note   CSN: [id number] Arrival date & time: 07/16/21  1626     History  Chief Complaint  Patient presents with   Constipation    Randy Fry is a 36 y.o. male.  Pt is a 36 yo male with a pmhx significant for constipation and schizophrenia.  Pt has felt constipated for the past week.  He denies any n/v.  He has taken Miralax once.  He is here for a refill of his anusol suppository for hemorrhoid.  Pt denies f/c.  No abd pain.      Home Medications Prior to Admission medications   Medication Sig Start Date End Date Taking? Authorizing Provider  hydrocortisone (ANUSOL-HC) 25 MG suppository Place 1 suppository (25 mg total) rectally 2 (two) times daily. 07/16/21  Yes Jacalyn Lefevre, MD  polyethylene glycol (MIRALAX / GLYCOLAX) 17 g packet Take 17 g by mouth daily as needed for moderate constipation. 07/16/21   Jacalyn Lefevre, MD      Allergies    Patient has no known allergies.    Review of Systems   Review of Systems  Gastrointestinal:  Positive for constipation.  All other systems reviewed and are negative.  Physical Exam Updated Vital Signs BP (!) 117/91 (BP Location: Right Arm)   Pulse 72   Temp 98.3 F (36.8 C)   Resp 16   SpO2 100%  Physical Exam Vitals and nursing note reviewed.  Constitutional:      Appearance: Normal appearance.  HENT:     Head: Normocephalic and atraumatic.     Right Ear: External ear normal.     Left Ear: External ear normal.     Nose: Nose normal.     Mouth/Throat:     Mouth: Mucous membranes are moist.     Pharynx: Oropharynx is clear.  Eyes:     Extraocular Movements: Extraocular movements intact.     Conjunctiva/sclera: Conjunctivae normal.     Pupils: Pupils are equal, round, and reactive to light.  Cardiovascular:     Rate and Rhythm: Normal rate and regular rhythm.     Pulses: Normal pulses.     Heart sounds: Normal heart sounds.  Pulmonary:     Effort:  Pulmonary effort is normal.     Breath sounds: Normal breath sounds.  Abdominal:     General: Abdomen is flat. Bowel sounds are normal.     Palpations: Abdomen is soft.  Musculoskeletal:        General: Normal range of motion.     Cervical back: Normal range of motion and neck supple.  Skin:    General: Skin is warm.     Capillary Refill: Capillary refill takes less than 2 seconds.  Neurological:     General: No focal deficit present.     Mental Status: He is alert.  Psychiatric:        Mood and Affect: Mood normal.    ED Results / Procedures / Treatments   Labs (all labs ordered are listed, but only abnormal results are displayed) Labs Reviewed - No data to display  EKG None  Radiology No results found.  Procedures Procedures    Medications Ordered in ED Medications - No data to display  ED Course/ Medical Decision Making/ A&P                           Medical Decision Making Risk Prescription drug management.  This patient presents to the ED for concern of constipation, this involves an extensive number of treatment options, and is a complaint that carries with it a high risk of complications and morbidity.  The differential diagnosis includes constipation, sbo   Co morbidities that complicate the patient evaluation  schizophrenia   Additional history obtained:  Additional history obtained from epic chart review   Medicines ordered and prescription drug management:  I have reviewed the patients home medicines and have made adjustments as needed   Test Considered:  Xray, but clinically, pt has so signs of sbo   Problem List / ED Course:  Constipation:  pt given a refill for miralax and anusol.  He is to increase fluid intake and increase fiber intake.  Return if worse.  He needs to establish care with a pcp.  He is given the number to [facility].   Social Determinants of Health:  Lives at home   Dispostion:  After consideration of the  diagnostic results and the patients response to treatment, I feel that the patent would benefit from discharge with outpatient f/u.          Final Clinical Impression(s) / ED Diagnoses Final diagnoses:  Constipation, unspecified constipation type    Rx / DC Orders ED Discharge Orders          Ordered    hydrocortisone (ANUSOL-HC) 25 MG suppository  2 times daily        07/16/21 1800    polyethylene glycol (MIRALAX / GLYCOLAX) 17 g packet  Daily PRN,   Status:  Discontinued        07/16/21 1800    polyethylene glycol (MIRALAX / GLYCOLAX) 17 g packet  Daily PRN        07/16/21 1801              Isla Pence, MD 07/16/21 1808

## 2021-07-18 ENCOUNTER — Telehealth: Payer: Self-pay

## 2021-07-18 NOTE — Telephone Encounter (Signed)
Pharmacy called as they did not get escripts from ED visit. Called in annusol suppositories as written by Para Skeans MD.

## 2021-09-23 ENCOUNTER — Other Ambulatory Visit: Payer: Self-pay

## 2021-09-23 ENCOUNTER — Encounter (HOSPITAL_COMMUNITY): Payer: Self-pay | Admitting: *Deleted

## 2021-09-23 ENCOUNTER — Emergency Department (HOSPITAL_COMMUNITY)
Admission: EM | Admit: 2021-09-23 | Discharge: 2021-09-23 | Disposition: A | Discharge status: Home / Self Care | Payer: Self-pay | Attending: Emergency Medicine | Admitting: Emergency Medicine

## 2021-09-23 ENCOUNTER — Emergency Department (HOSPITAL_COMMUNITY): Payer: Self-pay

## 2021-09-23 ENCOUNTER — Emergency Department (HOSPITAL_BASED_OUTPATIENT_CLINIC_OR_DEPARTMENT_OTHER)
Admit: 2021-09-23 | Discharge: 2021-09-23 | Disposition: A | Discharge status: Home / Self Care | Payer: Self-pay | Attending: Emergency Medicine | Admitting: Emergency Medicine

## 2021-09-23 DIAGNOSIS — R609 Edema, unspecified: Secondary | ICD-10-CM

## 2021-09-23 DIAGNOSIS — M7989 Other specified soft tissue disorders: Secondary | ICD-10-CM | POA: Insufficient documentation

## 2021-09-23 DIAGNOSIS — K5900 Constipation, unspecified: Secondary | ICD-10-CM

## 2021-09-23 DIAGNOSIS — L03114 Cellulitis of left upper limb: Secondary | ICD-10-CM

## 2021-09-23 MED ORDER — GOLYTELY 236 G PO SOLR: 4000.0000 mL | Freq: Once | ORAL | 0 refills | Status: AC

## 2021-09-23 MED ORDER — CEPHALEXIN 500 MG PO CAPS: 500.0000 mg | ORAL_CAPSULE | Freq: Three times a day (TID) | ORAL | 0 refills | Status: AC

## 2021-09-23 MED ORDER — MILK OF MAGNESIA 7.75 % PO SUSP: 30.0000 mL | Freq: Every day | ORAL | 0 refills | Status: DC | PRN

## 2021-09-23 NOTE — ED Notes (Signed)
Pt ambulated to restroom with steady gait.

## 2021-09-23 NOTE — ED Triage Notes (Signed)
Pt states its been about 2 weeks since he had a bowel movement. He has been taking OTC meds without relief. Also having swelling and redness in the left hand to which he denies injury.

## 2021-09-23 NOTE — ED Notes (Signed)
Vascular at the bedside. 

## 2021-09-23 NOTE — Progress Notes (Signed)
Left upper extremity venous duplex completed. Refer to "CV Proc" under chart review to view preliminary results.  09/23/2021 10:32 AM Eula Fried., MHA, RVT, RDCS, RDMS

## 2021-09-23 NOTE — Discharge Instructions (Addendum)
Keflex antibiotic sent to pharmacy for cellulitis of left hand.  Milk of magnesia sent to pharmacy for constipation.  Take 30 mL daily as needed for constipation.  If no improvement in 2 days start GoLytely.  GoLytely is normally used as a bowel prep for colonoscopy.  Will be given a 4000 mL solution.  Drink 200 mL at a time and repeat every 30 minutes until you have a bowel movement.  These are both very dehydrating so make sure you drink lots of water.

## 2021-09-23 NOTE — ED Provider Notes (Signed)
[facility] EMERGENCY DEPARTMENT Provider Note   CSN: [id number] Arrival date & time: 09/23/21  0419     History  Chief Complaint  Patient presents with   Constipation    Randy Fry is a 36 y.o. male.  Patient is a 36 year old male presenting for left hand swelling and constipation.  Patient admits to left hip swelling over the past several days and redness.  Denies any history of pain, falls, trauma, or wounds.  Has previous history of DVT or pulmonary embolisms, blood clotting disorders, surgeries in the past 4 months, prolonged immobilization, or hormone therapy use.  Also admits to constipation x2 weeks.  States has been taking MiraLAX without any improvement of symptoms at home.  Denies any abdominal pain, nausea, vomiting, or tolerating p.o.  The history is provided by the patient. No language interpreter was used.  Constipation      Home Medications Prior to Admission medications   Medication Sig Start Date End Date Taking? Authorizing Provider  cephALEXin (KEFLEX) 500 MG capsule Take 1 capsule (500 mg total) by mouth 3 (three) times daily for 7 days. 09/23/21 09/30/21 Yes Edwin Dada P, DO  magnesium hydroxide (MILK OF MAGNESIA) 400 MG/5ML suspension Take 30 mLs by mouth daily as needed for mild constipation. 09/23/21  Yes Edwin Dada P, DO  polyethylene glycol (GOLYTELY) 236 g solution Take 4,000 mLs by mouth once for 1 dose. Drink 200 mL, wait 30 minutes for bowel movement, repeat as needed 09/23/21 09/23/21 Yes Edwin Dada P, DO  hydrocortisone (ANUSOL-HC) 25 MG suppository Place 1 suppository (25 mg total) rectally 2 (two) times daily. 07/16/21   Jacalyn Lefevre, MD  polyethylene glycol (MIRALAX / GLYCOLAX) 17 g packet Take 17 g by mouth daily as needed for moderate constipation. 07/16/21   Jacalyn Lefevre, MD      Allergies    Patient has no known allergies.    Review of Systems   Review of Systems  Gastrointestinal:  Positive for constipation.     Physical Exam Updated Vital Signs BP (!) 118/101   Pulse 61   Temp 98.2 F (36.8 C) (Oral)   Resp 16   SpO2 100%  Physical Exam  ED Results / Procedures / Treatments   Labs (all labs ordered are listed, but only abnormal results are displayed) Labs Reviewed - No data to display  EKG None  Radiology DG Hand Complete Left  Result Date: 09/23/2021 CLINICAL DATA:  Left hand swelling and redness, unknown etiology. EXAM: LEFT HAND - COMPLETE 3+ VIEW COMPARISON:  None Available. FINDINGS: There is no evidence of fracture or dislocation. There is no evidence of arthropathy or other focal bone abnormality. There is generalized soft tissue swelling over the wrist and hand. No soft tissue gas or radiopaque foreign body are seen. IMPRESSION: Soft tissue swelling, without acute underlying bone abnormality or destructive process. Electronically Signed   By: Almira Bar M.D.   On: 09/23/2021 05:26    Procedures Procedures    Medications Ordered in ED Medications - No data to display  ED Course/ Medical Decision Making/ A&P                           Medical Decision Making Amount and/or Complexity of Data Reviewed Radiology: ordered.   52:86 AM 36 year old male presenting for left hand swelling and constipation.  Patient is alert oriented x3, no acute distress, afebrile, see vital signs.  Abdomen is soft without tenderness.  No history of pain, vomiting, difficulty tolerating p.o.  Low suspicion bowel obstruction.  Minimal improvement with MiraLAX at home.  Patient prescribed milk of magnesia and instructed to use 30 mL twice daily as needed for bowel movement and increase oral hydration.  GoLytely prescribed as a last resort solution to constipation.  Instructions provided.  Patient also has erythema and swelling of the left hand.  No wounds or tenderness on exam.  Concerns for cellulitis however will rule out DVT due to no pain.  Her son demonstrates no DVT.  Will treat with  Keflex for cellulitis.  X-ray demonstrates no fractures.  Patient otherwise stable for discharge  Patient in no distress and overall condition improved here in the ED. Detailed discussions were had with the patient regarding current findings, and need for close f/u with PCP or on call doctor. The patient has been instructed to return immediately if the symptoms worsen in any way for re-evaluation. Patient verbalized understanding and is in agreement with current care plan. All questions answered prior to discharge.         Final Clinical Impression(s) / ED Diagnoses Final diagnoses:  Constipation, unspecified constipation type  Cellulitis of left upper extremity    Rx / DC Orders ED Discharge Orders          Ordered    cephALEXin (KEFLEX) 500 MG capsule  3 times daily        09/23/21 1023    magnesium hydroxide (MILK OF MAGNESIA) 400 MG/5ML suspension  Daily PRN        09/23/21 1024    polyethylene glycol (GOLYTELY) 236 g solution   Once        09/23/21 1025           Discharge instructions:  Keflex antibiotic sent to pharmacy for cellulitis of left hand.  Milk of magnesia sent to pharmacy for constipation.  Take 30 mL daily as needed for constipation.  If no improvement in 2 days start GoLytely.  GoLytely is normally used as a bowel prep for colonoscopy.  Will be given a 4000 mL solution.  Drink 200 mL at a time and repeat every 30 minutes until you have a bowel movement.  These are both very dehydrating so make sure you drink lots of water.   Edwin Dada P, DO 09/23/21 1026

## 2021-12-10 IMAGING — DX DG ABDOMEN 1V
1 series · 1 of 1 positions shown · non-contrast
Comparison: 09/22/2018

CLINICAL DATA: Constipation. Abdominal discomfort.

EXAM:
ABDOMEN - 1 VIEW

[t abdomen supine]
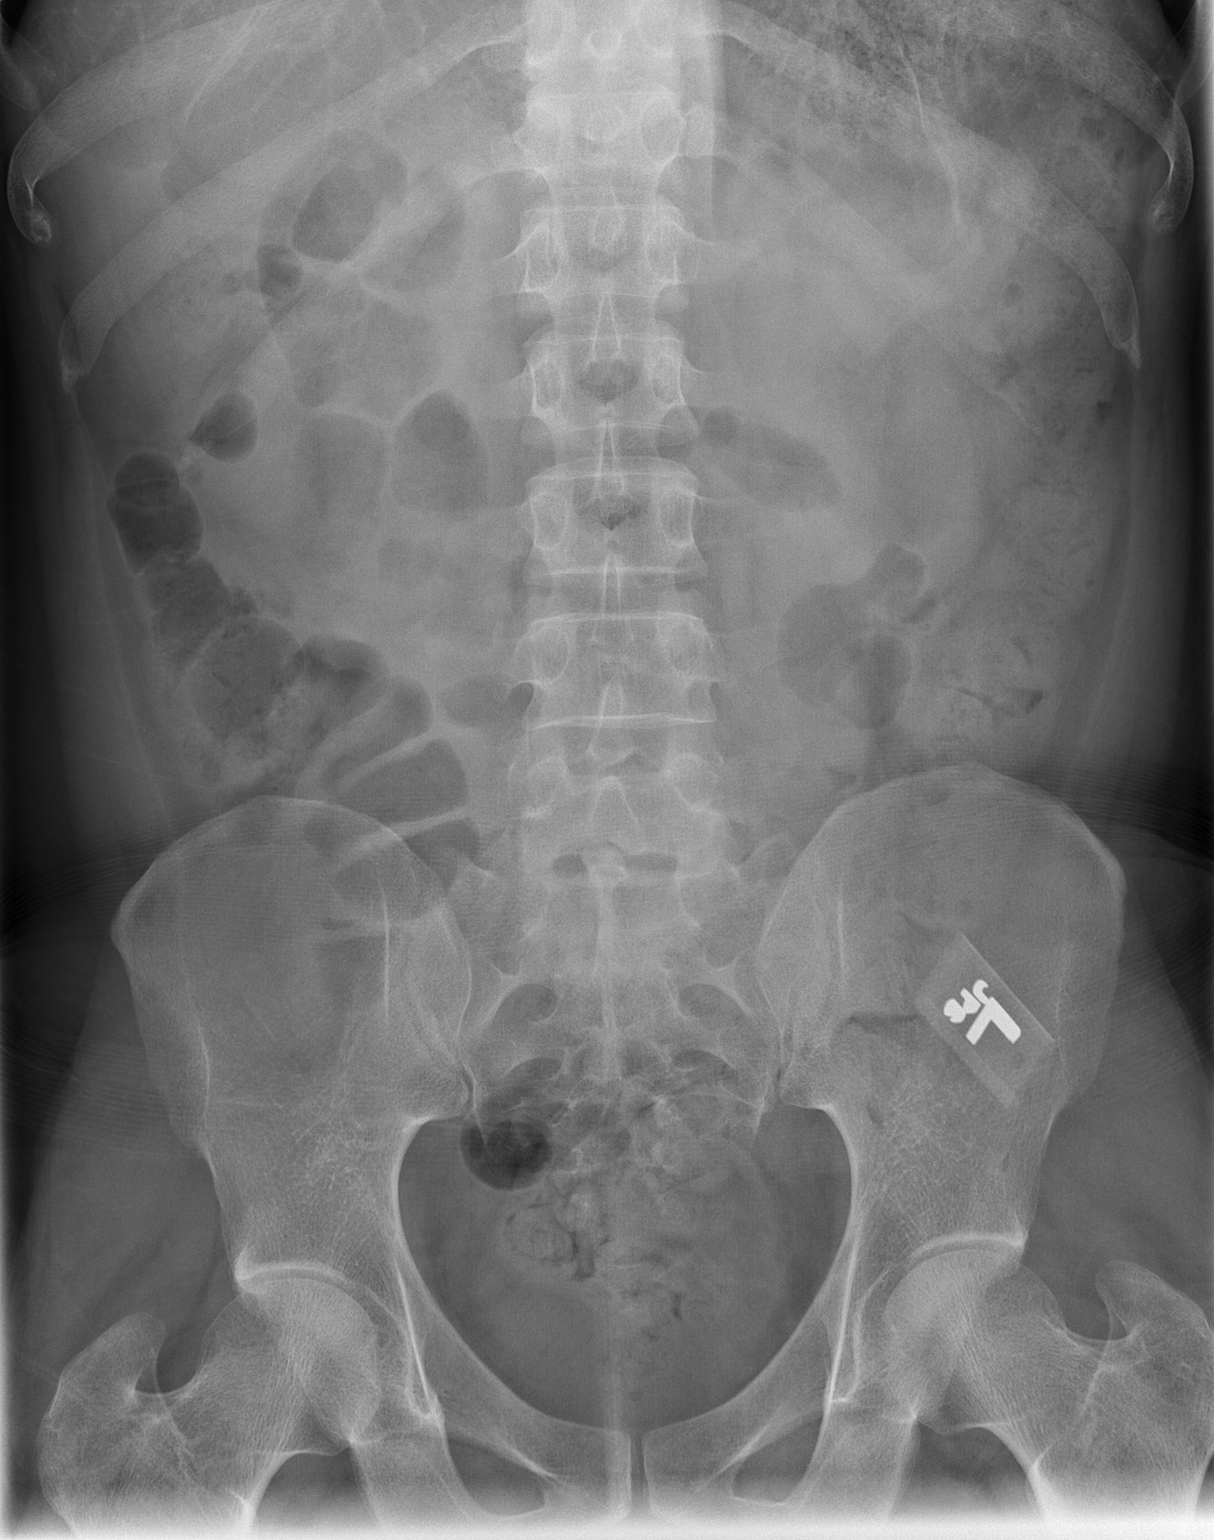

[1 of 1 positions shown; findings below may reference images not displayed]

FINDINGS: Nonobstructive bowel gas pattern. Moderate amount of fecal matter
within the colon, within the range of normal. No abnormal
calcifications or bone findings.
IMPRESSION: Within normal limits. Moderate amount of fecal matter within the
colon, within the range of normal radiographically.

## 2022-01-18 ENCOUNTER — Encounter (HOSPITAL_COMMUNITY): Payer: Self-pay | Admitting: Emergency Medicine

## 2022-01-18 ENCOUNTER — Emergency Department (HOSPITAL_COMMUNITY)
Admission: EM | Admit: 2022-01-18 | Discharge: 2022-01-18 | Disposition: A | Discharge status: Home / Self Care | Payer: Self-pay | Attending: Emergency Medicine | Admitting: Emergency Medicine

## 2022-01-18 ENCOUNTER — Other Ambulatory Visit: Payer: Self-pay

## 2022-01-18 DIAGNOSIS — X58XXXA Exposure to other specified factors, initial encounter: Secondary | ICD-10-CM | POA: Insufficient documentation

## 2022-01-18 DIAGNOSIS — Y9389 Activity, other specified: Secondary | ICD-10-CM | POA: Insufficient documentation

## 2022-01-18 DIAGNOSIS — S025XXA Fracture of tooth (traumatic), initial encounter for closed fracture: Secondary | ICD-10-CM | POA: Insufficient documentation

## 2022-01-18 DIAGNOSIS — K029 Dental caries, unspecified: Secondary | ICD-10-CM | POA: Insufficient documentation

## 2022-01-18 MED ORDER — PENICILLIN V POTASSIUM 500 MG PO TABS: 500.0000 mg | ORAL_TABLET | Freq: Four times a day (QID) | ORAL | 0 refills | Status: AC

## 2022-01-18 NOTE — Discharge Instructions (Signed)
Take the prescribed medication as directed. Follow-up with Dr. Leanord Asal--- call his office in the morning for appt.  You may also try one of the dental clinics listed on resource guide on back. Return to the ED for new or worsening symptoms.

## 2022-01-18 NOTE — ED Provider Notes (Signed)
[facility] EMERGENCY DEPARTMENT Provider Note   CSN: [id number] Arrival date & time: 01/18/22  2243     History  Chief Complaint  Patient presents with   Dental Problem    Randy Fry is a 36 y.o. male.  The history is provided by the patient and medical records.    36 y.o. M here after one of his rigth upper molars chipped while brushing teeth this evening.  States some mild pain.  Does not have a dentist and was hoping for referral.  Denies fever, bleeding, facial swelling, difficulty swallowing, etc.  Home Medications Prior to Admission medications   Medication Sig Start Date End Date Taking? Authorizing Provider  penicillin v potassium (VEETID) 500 MG tablet Take 1 tablet (500 mg total) by mouth 4 (four) times daily for 10 days. 01/18/22 01/28/22 Yes Garlon Hatchet, PA-C  hydrocortisone (ANUSOL-HC) 25 MG suppository Place 1 suppository (25 mg total) rectally 2 (two) times daily. 07/16/21   Jacalyn Lefevre, MD  magnesium hydroxide (MILK OF MAGNESIA) 400 MG/5ML suspension Take 30 mLs by mouth daily as needed for mild constipation. 09/23/21   Edwin Dada P, DO  polyethylene glycol (MIRALAX / GLYCOLAX) 17 g packet Take 17 g by mouth daily as needed for moderate constipation. 07/16/21   Jacalyn Lefevre, MD      Allergies    Patient has no known allergies.    Review of Systems   Review of Systems  HENT:  Positive for dental problem.   All other systems reviewed and are negative.   Physical Exam Updated Vital Signs BP 127/84 (BP Location: Right Arm)   Pulse 76   Temp 97.9 F (36.6 C) (Oral)   Resp 16   SpO2 98%  Physical Exam Vitals and nursing note reviewed.  Constitutional:      Appearance: He is well-developed.  HENT:     Head: Normocephalic and atraumatic.     Mouth/Throat:     Comments: Teeth largely in poor dentition, right upper 2nd molar broken and decayed, surrounding gingiva normal in appearance without abscess/fluid collection,  handling secretions appropriately, no trismus, no facial or neck swelling, normal phonation without stridor Eyes:     Conjunctiva/sclera: Conjunctivae normal.     Pupils: Pupils are equal, round, and reactive to light.  Cardiovascular:     Rate and Rhythm: Normal rate and regular rhythm.     Heart sounds: Normal heart sounds.  Pulmonary:     Effort: Pulmonary effort is normal.     Breath sounds: Normal breath sounds.  Abdominal:     General: Bowel sounds are normal.     Palpations: Abdomen is soft.  Musculoskeletal:        General: Normal range of motion.     Cervical back: Normal range of motion.  Skin:    General: Skin is warm and dry.  Neurological:     Mental Status: He is alert and oriented to person, place, and time.     ED Results / Procedures / Treatments   Labs (all labs ordered are listed, but only abnormal results are displayed) Labs Reviewed - No data to display  EKG None  Radiology No results found.  Procedures Procedures    Medications Ordered in ED Medications - No data to display  ED Course/ Medical Decision Making/ A&P                           Medical Decision Making  Risk Prescription drug management.   36 y.o. M here with dental pain s/p broken right upper molar  No facial or neck swelling.  No drainable fluid collection or abscess present.  Not clinically concerning for ludwig's angina.  Will start on abx and refer to dentist.  Can return here for new concerns.  Final Clinical Impression(s) / ED Diagnoses Final diagnoses:  Closed fracture of tooth, initial encounter    Rx / DC Orders ED Discharge Orders          Ordered    penicillin v potassium (VEETID) 500 MG tablet  4 times daily        01/18/22 2259              Garlon Hatchet, PA-C 01/18/22 2337    Benjiman Core, MD 01/18/22 2357

## 2022-01-18 NOTE — ED Triage Notes (Signed)
Pt reported to ED with c/o chipped tooth that occurred while brushing teeth this evening.

## 2022-02-23 ENCOUNTER — Emergency Department (HOSPITAL_COMMUNITY)
Admission: EM | Admit: 2022-02-23 | Discharge: 2022-02-23 | Discharge status: Against Medical Advice | Payer: Medicaid Other | Attending: Emergency Medicine | Admitting: Emergency Medicine

## 2022-02-23 ENCOUNTER — Encounter (HOSPITAL_COMMUNITY): Payer: Self-pay | Admitting: Emergency Medicine

## 2022-02-23 DIAGNOSIS — K5900 Constipation, unspecified: Secondary | ICD-10-CM | POA: Diagnosis not present

## 2022-02-23 DIAGNOSIS — Z5321 Procedure and treatment not carried out due to patient leaving prior to being seen by health care provider: Secondary | ICD-10-CM | POA: Insufficient documentation

## 2022-02-23 NOTE — ED Notes (Signed)
Called for room. No response 

## 2022-02-23 NOTE — ED Triage Notes (Signed)
Pt arrives via EMS from home where pt was on toilet and complaining of abd pain and constipation for 1 day. Pt did not try any OTC meds.

## 2022-02-23 NOTE — ED Triage Notes (Signed)
Pt reports he had BM while in waiting room.

## 2022-02-23 NOTE — ED Provider Triage Note (Signed)
Emergency Medicine Provider Triage Evaluation Note  Randy Fry , a 36 y.o. male  was evaluated in triage.  Pt complains of 1 day of constipation.  Patient endorses taking MiraLAX at home.  He states that while in the waiting room he had a average sized bowel movement.  He endorses drinking water when he can and taking stool softeners.  He denies abdominal pain, nausea, vomiting, chest pain, shortness of breath.  Patient does have history of schizophrenia  Review of Systems  Positive: As above Negative: As above  Physical Exam  SpO2 96%  Gen:   Awake, no distress   Resp:  Normal effort  MSK:   Moves extremities without difficulty  Other:    Medical Decision Making  Medically screening exam initiated at 4:27 PM.  Appropriate orders placed.  Owyn Marrin was informed that the remainder of the evaluation will be completed by another provider, this initial triage assessment does not replace that evaluation, and the importance of remaining in the ED until their evaluation is complete.     Darrick Grinder, PA-C 02/23/22 1629

## 2022-02-25 ENCOUNTER — Other Ambulatory Visit: Payer: Self-pay

## 2022-02-25 ENCOUNTER — Emergency Department (HOSPITAL_COMMUNITY): Payer: Medicaid Other

## 2022-02-25 ENCOUNTER — Emergency Department (HOSPITAL_COMMUNITY)
Admission: EM | Admit: 2022-02-25 | Discharge: 2022-02-25 | Disposition: A | Discharge status: Home / Self Care | Payer: Medicaid Other | Attending: Emergency Medicine | Admitting: Emergency Medicine

## 2022-02-25 ENCOUNTER — Encounter (HOSPITAL_COMMUNITY): Payer: Self-pay

## 2022-02-25 DIAGNOSIS — K5900 Constipation, unspecified: Secondary | ICD-10-CM | POA: Insufficient documentation

## 2022-02-25 DIAGNOSIS — R143 Flatulence: Secondary | ICD-10-CM | POA: Diagnosis not present

## 2022-02-25 MED ORDER — SENNOSIDES-DOCUSATE SODIUM 8.6-50 MG PO TABS
2.0000 | ORAL_TABLET | Freq: Once | ORAL | Status: AC
  Administered 2022-02-25: 2 via ORAL
  Filled 2022-02-25: qty 2

## 2022-02-25 MED ORDER — ACETAMINOPHEN 325 MG PO TABS
650.0000 mg | ORAL_TABLET | Freq: Once | ORAL | Status: AC
  Administered 2022-02-25: 650 mg via ORAL
  Filled 2022-02-25: qty 2

## 2022-02-25 MED ORDER — GLYCERIN (ADULT) 2 G RE SUPP: 1.0000 | RECTAL | 0 refills | Status: DC | PRN

## 2022-02-25 MED ORDER — POLYETHYLENE GLYCOL 3350 17 G PO PACK
17.0000 g | PACK | Freq: Every day | ORAL | Status: DC
  Administered 2022-02-25: 17 g via ORAL
  Filled 2022-02-25: qty 1

## 2022-02-25 NOTE — ED Provider Notes (Signed)
[facility] EMERGENCY DEPARTMENT Provider Note   CSN: [id number] Arrival date & time: 02/25/22  1131     History No chief complaint on file.   Randy Fry is a 36 y.o. male with h/o constipation, paranoid schizophrenia presents to the ER for evaluation of constipation for the past week. The patient reports that he was able to produce a small bowel movement 2 days ago that was hard. He denies any abdominal pain, nausea, vomiting, or unrinary symptoms. He reports he has still been eating and drinking normally.  He reports he is still passing gas.  He reports he has a history of constipation.  He reports he has tried MiraLAX once or twice but has not had any relief.  He is requesting a suppository prescription.  HPI     Home Medications Prior to Admission medications   Medication Sig Start Date End Date Taking? Authorizing Provider  glycerin adult 2 g suppository Place 1 suppository rectally as needed for constipation. 02/25/22  Yes Achille Rich, PA-C  hydrocortisone (ANUSOL-HC) 25 MG suppository Place 1 suppository (25 mg total) rectally 2 (two) times daily. 07/16/21   Jacalyn Lefevre, MD  magnesium hydroxide (MILK OF MAGNESIA) 400 MG/5ML suspension Take 30 mLs by mouth daily as needed for mild constipation. 09/23/21   Edwin Dada P, DO  polyethylene glycol (MIRALAX / GLYCOLAX) 17 g packet Take 17 g by mouth daily as needed for moderate constipation. 07/16/21   Jacalyn Lefevre, MD      Allergies    Patient has no known allergies.    Review of Systems   Review of Systems  Constitutional:  Negative for chills and fever.  Gastrointestinal:  Positive for constipation. Negative for abdominal pain, nausea and vomiting.  Genitourinary:  Negative for dysuria and hematuria.    Physical Exam Updated Vital Signs BP 114/75 (BP Location: Right Arm)   Pulse 63   Temp 97.8 F (36.6 C) (Oral)   Resp 16   Ht 5\' 9"  (1.753 m)   Wt 74.8 kg   SpO2 99%   BMI 24.37 kg/m   Physical Exam Vitals and nursing note reviewed.  Constitutional:      General: He is not in acute distress.    Appearance: Normal appearance. He is not ill-appearing or toxic-appearing.  HENT:     Head: Normocephalic and atraumatic.  Eyes:     General: No scleral icterus. Cardiovascular:     Rate and Rhythm: Normal rate and regular rhythm.  Pulmonary:     Effort: Pulmonary effort is normal. No respiratory distress.     Breath sounds: Normal breath sounds.  Abdominal:     General: Bowel sounds are normal. There is no distension.     Palpations: Abdomen is soft.     Tenderness: There is no abdominal tenderness. There is no guarding or rebound.  Musculoskeletal:        General: No deformity.  Skin:    General: Skin is warm and dry.  Neurological:     General: No focal deficit present.     Mental Status: He is alert. Mental status is at baseline.     ED Results / Procedures / Treatments   Labs (all labs ordered are listed, but only abnormal results are displayed) Labs Reviewed - No data to display  EKG None  Radiology DG Abdomen 1 View  Result Date: 02/25/2022 CLINICAL DATA:  Constipation for 1 week.  Flatulence. EXAM: ABDOMEN - 1 VIEW COMPARISON:  Radiographs 05/09/2019 and 05/25/2017.  CT 07/03/2017. FINDINGS: Normal nonobstructive bowel gas pattern. Mildly prominent stool within the right colon and rectum. No supine evidence of free intraperitoneal air or suspicious abdominal calcification. The bones appear unremarkable. IMPRESSION: Mildly prominent stool in the right colon and rectum. No evidence of bowel obstruction. Electronically Signed   By: Carey Bullocks M.D.   On: 02/25/2022 14:38    Procedures Procedures   Medications Ordered in ED Medications  polyethylene glycol (MIRALAX / GLYCOLAX) packet 17 g (17 g Oral Given 02/25/22 1606)  senna-docusate (Senokot-S) tablet 2 tablet (2 tablets Oral Given 02/25/22 1606)  acetaminophen (TYLENOL) tablet 650 mg (650 mg  Oral Given 02/25/22 1606)    ED Course/ Medical Decision Making/ A&P                           Medical Decision Making Risk OTC drugs.   36 year old male presents emerged from today for evaluation of constipation for the past week.  Differential diagnosis includes but is not limited to small bowel obstruction, constipation, malignancy.  Vital signs are unremarkable.  Physical exam as noted above.  On previous chart evaluation, patient has been seen multiple times with constipation.  He reports only using MiraLAX once or twice in the past 2 weeks.  I think this patient benefit from a more scheduled bowel regimen.  I commended he tried to skip MiraLAX daily perhaps of his constipation.  He is requesting a suppository prescription.  Will prescribe him some glycerin suppositories.  I doubt that this patient is having a small bowel obstruction.  He is still able to pass some stool, has a history of constipation, and is still passing gas.  His abdomen is soft and nontender.  Is nondistended.  His abdominal x-ray shows mildly prominent stool in the right colon and rectum but there is no evidence of any bowel obstruction.  Patient was given MiraLAX and senna docusate while in the emergency department as well as some Tylenol.  He apparently had a headache that was slight upon arrival but does not mention this to me. He currently does not have any abdominal pain, nausea, or vomiting.  Prescription sent in for some glycerin suppositories.  We discussed return precautions red flag symptoms such as abdominal pain, nausea, vomiting, rectal bleeding, persistent constipation, fever, inability to pass gas to return to the emergency department.  Patient verbalizes understanding and agrees to the plan.  Patient is stable being discharged home in good condition.   Final Clinical Impression(s) / ED Diagnoses Final diagnoses:  Constipation, unspecified constipation type    Rx / DC Orders ED Discharge Orders           Ordered    glycerin adult 2 g suppository  As needed        02/25/22 1726              Achille Rich, PA-C 02/25/22 1902    Gwyneth Sprout, MD 03/01/22 1234

## 2022-02-25 NOTE — ED Provider Triage Note (Signed)
Emergency Medicine Provider Triage Evaluation Note  Randy Fry , a 36 y.o. male  was evaluated in triage.  Pt complains of constipationx1 week. Was seen at ED 2 days ago and laxatives were never prescribed. +gas. No N/V. Also c/o slight headache.  Review of Systems  Positive: Headache, constipation Negative: N/v  Physical Exam  BP 113/83 (BP Location: Right Arm)   Pulse 80   Temp 98.2 F (36.8 C) (Oral)   Resp 16   Ht 5\' 9"  (1.753 m)   Wt 74.8 kg   SpO2 100%   BMI 24.37 kg/m  Gen:   Awake, no distress   Resp:  Normal effort  MSK:   Moves extremities without difficulty  Other:    Medical Decision Making  Medically screening exam initiated at 1:58 PM.  Appropriate orders placed.  Randy Fry was informed that the remainder of the evaluation will be completed by another provider, this initial triage assessment does not replace that evaluation, and the importance of remaining in the ED until their evaluation is complete.     , Pete Pelt 02/25/22 1359

## 2022-02-25 NOTE — ED Triage Notes (Signed)
Pt states he was here 2 days ago, seen for constipation, states "you were supposed to give me laxatives"; last BM last week; denies N/V; endorses slight HA

## 2022-02-25 NOTE — Discharge Instructions (Signed)
You were seen in the ER for evaluation of your constipation. I would like for you to try two scoops of Miralax in the morning and two in the evening until you produce a bowel movement like peanut butter. Since you would like suppositories, I can prescribe you those as well. Please make sure you are drinking plenty of fluids, mainly water.  If you have any pain, nausea, vomiting, still unable to produce a bowel movement, bloody stool, black stool, nausea, vomiting, fever, please return to the nearest emergency department for evaluation.  Otherwise, please present to your primary care for evaluation.  Contact a doctor if: You have pain that gets worse. You have a fever. You have not pooped for 4 days. You vomit. You are not hungry. You lose weight. You are bleeding from the opening of the butt (anus). You have thin, pencil-like poop. Get help right away if: You have a fever, and your symptoms suddenly get worse. You leak poop or have blood in your poop. Your belly feels hard or bigger than normal (bloated). You have very bad belly pain. You feel dizzy or you faint.

## 2022-06-16 ENCOUNTER — Encounter (HOSPITAL_COMMUNITY): Payer: Self-pay | Admitting: Emergency Medicine

## 2022-06-16 ENCOUNTER — Emergency Department (HOSPITAL_COMMUNITY)
Admission: EM | Admit: 2022-06-16 | Discharge: 2022-06-16 | Disposition: A | Discharge status: Home / Self Care | Payer: Medicaid Other | Attending: Emergency Medicine | Admitting: Emergency Medicine

## 2022-06-16 DIAGNOSIS — H1031 Unspecified acute conjunctivitis, right eye: Secondary | ICD-10-CM

## 2022-06-16 DIAGNOSIS — H579 Unspecified disorder of eye and adnexa: Secondary | ICD-10-CM | POA: Diagnosis present

## 2022-06-16 DIAGNOSIS — X58XXXA Exposure to other specified factors, initial encounter: Secondary | ICD-10-CM | POA: Diagnosis not present

## 2022-06-16 DIAGNOSIS — S0501XA Injury of conjunctiva and corneal abrasion without foreign body, right eye, initial encounter: Secondary | ICD-10-CM | POA: Insufficient documentation

## 2022-06-16 DIAGNOSIS — B9689 Other specified bacterial agents as the cause of diseases classified elsewhere: Secondary | ICD-10-CM | POA: Diagnosis not present

## 2022-06-16 MED ORDER — FLUORESCEIN SODIUM 1 MG OP STRP
1.0000 | ORAL_STRIP | Freq: Once | OPHTHALMIC | Status: AC
  Administered 2022-06-16: 1 via OPHTHALMIC
  Filled 2022-06-16: qty 1

## 2022-06-16 MED ORDER — OFLOXACIN 0.3 % OP SOLN: 2.0000 [drp] | Freq: Four times a day (QID) | OPHTHALMIC | 0 refills | Status: AC

## 2022-06-16 MED ORDER — OFLOXACIN 0.3 % OP SOLN: 2.0000 [drp] | Freq: Four times a day (QID) | OPHTHALMIC | 0 refills | Status: DC

## 2022-06-16 MED ORDER — TETRACAINE HCL 0.5 % OP SOLN
2.0000 [drp] | Freq: Once | OPHTHALMIC | Status: AC
  Administered 2022-06-16: 2 [drp] via OPHTHALMIC
  Filled 2022-06-16: qty 4

## 2022-06-16 NOTE — Discharge Instructions (Addendum)
You were seen in the ER today for evaluation of your right eye redness and itchiness.  It seems that you have some conjunctivitis which is irritation of your eye remove your contacts then.  Please do not wear any contact for the next 2 weeks.  You will need to use the eyedrop in your eyes for the next 5 days.  Please take as directed.  I include information for an eye doctor for you to call to schedule appointments to follow-up with.  Please make sure you call to schedule an appointment.  Remember, no contact for the next 2 weeks as this can worsen. Contact a doctor if: You keep having eye pain and other symptoms for more than 2 days. You get new symptoms, such as more redness, watery eyes, or discharge. You have discharge that makes your eyelids stick together in the morning. Your eye patch becomes so loose that you can blink your eye. Symptoms come back after your eye heals. Get help right away if: You have very bad eye pain that does not get better with medicine. You lose eyesight.

## 2022-06-16 NOTE — ED Triage Notes (Signed)
Pt having watery discharge in R eye starting 2 days ago. Feels like something in his eye. Eye is throbbing.

## 2022-06-16 NOTE — ED Provider Notes (Signed)
[place] EMERGENCY DEPARTMENT AT [facility] Provider Note   CSN: [id number] Arrival date & time: 06/16/22  0011     History Chief Complaint  Patient presents with   Eye Problem    Randy Fry is a 37 y.o. male with h/o paranoic schizophrenia presents to the ER for evaluation of right eye redness, itching, watery discharge for the past 2 days.  Patient reports he does wear contacts.  He denies any trauma to the eye.  Reports that it feels burning more than he has a foreign body sensation.  Denies any runny nose or nasal congestion.  Denies any fevers.  He reports he does have some blurry vision but blinks and it goes away.  No permanent blurry vision or loss of vision.  No pain on movement of the eyes.  No known drug allergies.   Eye Problem Associated symptoms: discharge, itching and redness        Home Medications Prior to Admission medications   Medication Sig Start Date End Date Taking? Authorizing Provider  glycerin adult 2 g suppository Place 1 suppository rectally as needed for constipation. 02/25/22   Achille Rich, PA-C  hydrocortisone (ANUSOL-HC) 25 MG suppository Place 1 suppository (25 mg total) rectally 2 (two) times daily. 07/16/21   Jacalyn Lefevre, MD  magnesium hydroxide (MILK OF MAGNESIA) 400 MG/5ML suspension Take 30 mLs by mouth daily as needed for mild constipation. 09/23/21   Edwin Dada P, DO  ofloxacin (OCUFLOX) 0.3 % ophthalmic solution Place 2 drops into the right eye 4 (four) times daily for 7 days. 1-2 drops every 2-4 hours while awake for the first 2 days, then 4 times daily for 5 days 06/16/22 06/23/22  Achille Rich, PA-C  polyethylene glycol (MIRALAX / GLYCOLAX) 17 g packet Take 17 g by mouth daily as needed for moderate constipation. 07/16/21   Jacalyn Lefevre, MD      Allergies    Patient has no known allergies.    Review of Systems   Review of Systems  Constitutional:  Negative for chills and fever.  HENT:  Negative for congestion  and rhinorrhea.   Eyes:  Positive for discharge, redness and itching. Negative for visual disturbance.    Physical Exam Updated Vital Signs BP (!) 129/90 (BP Location: Right Arm)   Pulse 93   Temp 98.2 F (36.8 C)   Resp 17   SpO2 98%  Physical Exam Vitals and nursing note reviewed.  Constitutional:      General: He is not in acute distress.    Appearance: He is not toxic-appearing.  HENT:     Nose: Nose normal.     Mouth/Throat:     Mouth: Mucous membranes are moist.  Eyes:     General: No scleral icterus.    Extraocular Movements: Extraocular movements intact.     Pupils: Pupils are equal, round, and reactive to light.      Comments: EOMI, PERRLA.  Right conjunctiva injection.  Dry.  Watery.  Contacts present still in eye. No hyphema or hypopyon.  With fluorescein exam - small linear corneal abrasion/ulceration.  Negative Seidel sign.    Pulmonary:     Effort: Pulmonary effort is normal. No respiratory distress.  Skin:    General: Skin is dry.     Findings: No rash.  Neurological:     General: No focal deficit present.     Mental Status: He is alert. Mental status is at baseline.     ED Results / Procedures /  Treatments   Labs (all labs ordered are listed, but only abnormal results are displayed) Labs Reviewed - No data to display  EKG None  Radiology No results found.  Procedures Procedures   Medications Ordered in ED Medications  tetracaine (PONTOCAINE) 0.5 % ophthalmic solution 2 drop (has no administration in time range)  fluorescein ophthalmic strip 1 strip (has no administration in time range)    ED Course/ Medical Decision Making/ A&P    Medical Decision Making Risk Prescription drug management.   37 y.o. male presents to the ER today for evaluation of right eye erythema and watery discharge. Differential diagnosis includes but is not limited to conjunctivitis, foreign body, globe rupture, corneal ulceration, corneal abrasion, dendritic  lesion. Vital signs show mildly elevated blood pressure otherwise unremarkable. Physical exam as noted above.   Patient has been wearing his contacts for the past 5+ days at least without removing them.  Likely because of his irritation.  On fluorescein exam there is a small linear abrasion marked above my physical exam.  I do not see any foreign bodies or any Seidel signs.  No signs of any hyphema or any hypopyon.  Given that he wears contacts, will discharge him on ofloxacin for Pseudomonas coverage.  Ophthalmology referral given.  Educated him on not wearing contacts for least 2 weeks.  We discussed plan at bedside. We discussed strict return precautions and red flag symptoms. The patient verbalized their understanding and agrees to the plan. The patient is stable and being discharged home in good condition.  Portions of this report may have been transcribed using voice recognition software. Every effort was made to ensure accuracy; however, inadvertent computerized transcription errors may be present.   Final Clinical Impression(s) / ED Diagnoses Final diagnoses:  Abrasion of right cornea, initial encounter  Acute bacterial conjunctivitis of right eye    Rx / DC Orders ED Discharge Orders          Ordered    ofloxacin (OCUFLOX) 0.3 % ophthalmic solution  4 times daily,   Status:  Discontinued        06/16/22 0444    ofloxacin (OCUFLOX) 0.3 % ophthalmic solution  4 times daily        06/16/22 0447              Achille Rich, PA-C 06/16/22 0601    Tilden Fossa, MD 06/16/22 [phone]

## 2022-08-23 ENCOUNTER — Emergency Department (HOSPITAL_COMMUNITY)
Admission: EM | Admit: 2022-08-23 | Discharge: 2022-08-24 | Discharge status: Against Medical Advice | Payer: Medicaid Other | Attending: Emergency Medicine | Admitting: Emergency Medicine

## 2022-08-23 ENCOUNTER — Other Ambulatory Visit: Payer: Self-pay

## 2022-08-23 DIAGNOSIS — K5900 Constipation, unspecified: Secondary | ICD-10-CM | POA: Insufficient documentation

## 2022-08-23 DIAGNOSIS — Z5321 Procedure and treatment not carried out due to patient leaving prior to being seen by health care provider: Secondary | ICD-10-CM | POA: Diagnosis not present

## 2022-08-23 NOTE — ED Notes (Signed)
No answer for triage x2 

## 2022-08-23 NOTE — ED Notes (Signed)
No answer for triage x1 

## 2022-08-23 NOTE — ED Notes (Signed)
No answer x3

## 2022-08-24 ENCOUNTER — Encounter (HOSPITAL_COMMUNITY): Payer: Self-pay

## 2022-08-24 ENCOUNTER — Emergency Department (HOSPITAL_COMMUNITY): Payer: Medicaid Other

## 2022-08-24 ENCOUNTER — Other Ambulatory Visit: Payer: Self-pay

## 2022-08-24 ENCOUNTER — Emergency Department (HOSPITAL_COMMUNITY)
Admission: EM | Admit: 2022-08-24 | Discharge: 2022-08-24 | Disposition: A | Discharge status: Home / Self Care | Payer: Medicaid Other | Source: Home / Self Care | Attending: Emergency Medicine | Admitting: Emergency Medicine

## 2022-08-24 DIAGNOSIS — K5900 Constipation, unspecified: Secondary | ICD-10-CM | POA: Insufficient documentation

## 2022-08-24 MED ORDER — POLYETHYLENE GLYCOL 3350 17 G PO PACK
17.0000 g | PACK | Freq: Once | ORAL | Status: AC
  Administered 2022-08-24: 17 g via ORAL
  Filled 2022-08-24: qty 1

## 2022-08-24 MED ORDER — POLYETHYLENE GLYCOL 3350 17 GM/SCOOP PO POWD: 17.0000 g | Freq: Every day | ORAL | 0 refills | Status: DC

## 2022-08-24 NOTE — ED Provider Notes (Signed)
[place] EMERGENCY DEPARTMENT AT [facility] Provider Note   CSN: [id number] Arrival date & time: 08/24/22  0244     History  Chief Complaint  Patient presents with   Follow-up    Randy Fry is a 37 y.o. male, no pertinent past medical history, who presents to the ED secondary to constipation.  He states he has not had a bowel movement for the last 2 days, and feels "he is having a hard time with his stools.  He states he had a stool 2 days ago and had to really struggle to have a stool.  Denies any blood in the toilet paper or toilet.  Bleeding denies any nausea or vomiting.  But states he is having a hard time having gas.    Home Medications Prior to Admission medications   Medication Sig Start Date End Date Taking? Authorizing Provider  polyethylene glycol powder (GLYCOLAX/MIRALAX) 17 GM/SCOOP powder Take 17 g by mouth daily. 08/24/22  Yes Laurel Harnden L, PA  glycerin adult 2 g suppository Place 1 suppository rectally as needed for constipation. 02/25/22   Achille Rich, PA-C  hydrocortisone (ANUSOL-HC) 25 MG suppository Place 1 suppository (25 mg total) rectally 2 (two) times daily. 07/16/21   Jacalyn Lefevre, MD  magnesium hydroxide (MILK OF MAGNESIA) 400 MG/5ML suspension Take 30 mLs by mouth daily as needed for mild constipation. 09/23/21   Franne Forts, DO      Allergies    Patient has no known allergies.    Review of Systems   Review of Systems  Gastrointestinal:  Positive for constipation. Negative for abdominal pain, nausea and vomiting.    Physical Exam Updated Vital Signs BP 112/82 (BP Location: Right Arm)   Pulse (!) 51   Temp 98.1 F (36.7 C) (Oral)   Resp 18   SpO2 100%  Physical Exam Vitals and nursing note reviewed.  Constitutional:      General: He is not in acute distress.    Appearance: He is well-developed.  HENT:     Head: Normocephalic and atraumatic.  Eyes:     Conjunctiva/sclera: Conjunctivae normal.  Cardiovascular:      Rate and Rhythm: Normal rate and regular rhythm.     Heart sounds: No murmur heard. Pulmonary:     Effort: Pulmonary effort is normal. No respiratory distress.     Breath sounds: Normal breath sounds.  Abdominal:     Palpations: Abdomen is soft.     Tenderness: There is no abdominal tenderness.  Musculoskeletal:        General: No swelling.     Cervical back: Neck supple.  Skin:    General: Skin is warm and dry.     Capillary Refill: Capillary refill takes less than 2 seconds.  Neurological:     Mental Status: He is alert.  Psychiatric:        Mood and Affect: Mood normal.     ED Results / Procedures / Treatments   Labs (all labs ordered are listed, but only abnormal results are displayed) Labs Reviewed - No data to display  EKG None  Radiology DG Abdomen 1 View  Result Date: 08/24/2022 CLINICAL DATA:  Constipation EXAM: ABDOMEN - 1 VIEW COMPARISON:  Previous studies including the examination of 02/24/2022 FINDINGS: Bowel gas pattern is nonspecific. Patrice Moates to moderate amount of stool is seen in right colon. There is no significant amount of stool in left colon and rectum. No abnormal masses or calcifications are seen. IMPRESSION: Nonspecific bowel  gas pattern. Kinisha Soper to moderate amount of stool is seen in right colon. There is no evidence of any significant amount of stool in left colon and rectum. Electronically Signed   By: Ernie Avena M.D.   On: 08/24/2022 08:06    Procedures Procedures    Medications Ordered in ED Medications  polyethylene glycol (MIRALAX / GLYCOLAX) packet 17 g (has no administration in time range)    ED Course/ Medical Decision Making/ A&P                             Medical Decision Making Patient is a 37 year old male, history of constipation x 2 days, has not passing gas per patient.  Will obtain a KUB just to evaluate for any kind of obstruction.  He has not had any nausea or vomiting.  Amount and/or Complexity of Data  Reviewed Radiology: ordered.    Details: KUB unremarkable no signs of obstruction Discussion of management or test interpretation with external provider(s): Patient here for constipation, KUB negative, will have him take MiraLAX outpatient, and follow-up with primary care doctor.  Return precautions emphasized.  Discussed drinking lots of fluids, eating fresh vegetables and fruits to increase his fiber intake.    Final Clinical Impression(s) / ED Diagnoses Final diagnoses:  Constipation, unspecified constipation type    Rx / DC Orders ED Discharge Orders          Ordered    polyethylene glycol powder (GLYCOLAX/MIRALAX) 17 GM/SCOOP powder  Daily        08/24/22 0835              Karlea Mckibbin, Harley Alto, PA 08/24/22 2952    Jacalyn Lefevre, MD 08/24/22 [phone]

## 2022-08-24 NOTE — Discharge Instructions (Signed)
Please follow-up with your primary care doctor, return to the ED if you have inability to pass gas, intractable nausea, vomiting, or have not had a bowel movement in greater than a week.

## 2022-08-24 NOTE — ED Triage Notes (Signed)
Pt reports he is here for a follow up of his constipation. Reports bowel movement yesterday morning and states he feels "some better"

## 2022-09-08 ENCOUNTER — Encounter (HOSPITAL_COMMUNITY): Payer: Self-pay

## 2022-09-08 ENCOUNTER — Other Ambulatory Visit: Payer: Self-pay

## 2022-09-08 ENCOUNTER — Emergency Department (HOSPITAL_COMMUNITY)
Admission: EM | Admit: 2022-09-08 | Discharge: 2022-09-09 | Disposition: A | Discharge status: Home / Self Care | Payer: Medicaid Other | Attending: Emergency Medicine | Admitting: Emergency Medicine

## 2022-09-08 DIAGNOSIS — Z5941 Food insecurity: Secondary | ICD-10-CM | POA: Diagnosis not present

## 2022-09-08 DIAGNOSIS — R000 Tachycardia, unspecified: Secondary | ICD-10-CM | POA: Diagnosis not present

## 2022-09-08 DIAGNOSIS — E860 Dehydration: Secondary | ICD-10-CM | POA: Diagnosis not present

## 2022-09-08 DIAGNOSIS — D72829 Elevated white blood cell count, unspecified: Secondary | ICD-10-CM | POA: Diagnosis not present

## 2022-09-08 DIAGNOSIS — R1110 Vomiting, unspecified: Secondary | ICD-10-CM | POA: Diagnosis not present

## 2022-09-08 DIAGNOSIS — R1111 Vomiting without nausea: Secondary | ICD-10-CM | POA: Diagnosis not present

## 2022-09-08 NOTE — ED Notes (Signed)
Notified pt of need of sample for UA.

## 2022-09-08 NOTE — ED Provider Notes (Signed)
[place] EMERGENCY DEPARTMENT AT [facility] Provider Note   CSN: [id number] Arrival date & time: 09/08/22  2328     History {Add pertinent medical, surgical, social history, OB history to HPI:1} Chief Complaint  Patient presents with   Emesis    Butler Pollak is a 37 y.o. male.  The history is provided by the patient and medical records.  Emesis Sabino Schoen is a 37 y.o. male who presents to the Emergency Department complaining of not eating.  He presents to the emergency department by EMS for EMS report of vomiting for 3 days.  Patient states that he has not eaten for 3 days due to lack of access to food.  He states that he has access to water but since he does not have transportation has been unable to eat.  No abdominal pain.  He denies vomiting.  He denies any attempt at self-harm and reports that he does have a safe place to stay.  No tobacco, alcohol, drugs.  He does state that he walks a lot.  He has a history of constipation and takes MiraLAX as needed.     Home Medications Prior to Admission medications   Medication Sig Start Date End Date Taking? Authorizing Provider  glycerin adult 2 g suppository Place 1 suppository rectally as needed for constipation. 02/25/22   Achille Rich, PA-C  hydrocortisone (ANUSOL-HC) 25 MG suppository Place 1 suppository (25 mg total) rectally 2 (two) times daily. 07/16/21   Jacalyn Lefevre, MD  magnesium hydroxide (MILK OF MAGNESIA) 400 MG/5ML suspension Take 30 mLs by mouth daily as needed for mild constipation. 09/23/21   Edwin Dada P, DO  polyethylene glycol powder (GLYCOLAX/MIRALAX) 17 GM/SCOOP powder Take 17 g by mouth daily. 08/24/22   Small, Brooke L, PA      Allergies    Patient has no known allergies.    Review of Systems   Review of Systems  Gastrointestinal:  Positive for vomiting.  All other systems reviewed and are negative.   Physical Exam Updated Vital Signs BP 120/74 (BP Location: Right Arm)   Pulse  (!) 113   Temp 98.7 F (37.1 C) (Oral)   Resp 19   SpO2 92%  Physical Exam Vitals and nursing note reviewed.  Constitutional:      Appearance: He is well-developed.  HENT:     Head: Normocephalic and atraumatic.  Cardiovascular:     Rate and Rhythm: Regular rhythm. Tachycardia present.     Heart sounds: No murmur heard. Pulmonary:     Effort: Pulmonary effort is normal. No respiratory distress.     Breath sounds: Normal breath sounds.  Abdominal:     Palpations: Abdomen is soft.     Tenderness: There is no abdominal tenderness. There is no guarding or rebound.  Musculoskeletal:        General: No tenderness.  Skin:    General: Skin is warm and dry.  Neurological:     Mental Status: He is alert and oriented to person, place, and time.  Psychiatric:        Behavior: Behavior normal.     ED Results / Procedures / Treatments   Labs (all labs ordered are listed, but only abnormal results are displayed) Labs Reviewed  LIPASE, BLOOD  COMPREHENSIVE METABOLIC PANEL  CBC  URINALYSIS, ROUTINE W REFLEX MICROSCOPIC    EKG None  Radiology No results found.  Procedures Procedures  {Document cardiac monitor, telemetry assessment procedure when appropriate:1}  Medications Ordered in ED Medications -  No data to display  ED Course/ Medical Decision Making/ A&P   {   Click here for ABCD2, HEART and other calculatorsREFRESH Note before signing :1}                          Medical Decision Making Amount and/or Complexity of Data Reviewed Labs: ordered.   ***  {Document critical care time when appropriate:1} {Document review of labs and clinical decision tools ie heart score, Chads2Vasc2 etc:1}  {Document your independent review of radiology images, and any outside records:1} {Document your discussion with family members, caretakers, and with consultants:1} {Document social determinants of health affecting pt's care:1} {Document your decision making why or why not  admission, treatments were needed:1} Final Clinical Impression(s) / ED Diagnoses Final diagnoses:  None    Rx / DC Orders ED Discharge Orders     None

## 2022-09-08 NOTE — ED Triage Notes (Signed)
Pt. Bib gcems for vomiting x3 days. Every time pt. Tries to eat or drink he vomits. Pt. Denies nausea and vomiting with EMS. States it only occurs when he tries to eat something. Pt. Was tachycardic with EMS with HR of 130. He was given 400 ml of NS HR improved to 107. CBG of 117, BP: 120/91, O2 98% RA.

## 2022-09-09 LAB — LIPASE, BLOOD: Lipase: 28 U/L (ref 11–51)

## 2022-09-09 LAB — URINALYSIS, ROUTINE W REFLEX MICROSCOPIC
Bilirubin Urine: NEGATIVE
Glucose, UA: NEGATIVE mg/dL
Hgb urine dipstick: NEGATIVE
Ketones, ur: 80 mg/dL — AB
Leukocytes,Ua: NEGATIVE
Nitrite: NEGATIVE
Protein, ur: NEGATIVE mg/dL
Specific Gravity, Urine: 1.018 (ref 1.005–1.030)
pH: 5 (ref 5.0–8.0)

## 2022-09-09 LAB — CBC
HCT: 39.5 % (ref 39.0–52.0)
Hemoglobin: 12.6 g/dL — ABNORMAL LOW (ref 13.0–17.0)
MCH: 27.5 pg (ref 26.0–34.0)
MCHC: 31.9 g/dL (ref 30.0–36.0)
MCV: 86.1 fL (ref 80.0–100.0)
Platelets: 275 10*3/uL (ref 150–400)
RBC: 4.59 MIL/uL (ref 4.22–5.81)
RDW: 14 % (ref 11.5–15.5)
WBC: 12.2 10*3/uL — ABNORMAL HIGH (ref 4.0–10.5)
nRBC: 0 % (ref 0.0–0.2)

## 2022-09-09 LAB — COMPREHENSIVE METABOLIC PANEL
ALT: 35 U/L (ref 0–44)
AST: 30 U/L (ref 15–41)
Albumin: 4.5 g/dL (ref 3.5–5.0)
Alkaline Phosphatase: 49 U/L (ref 38–126)
Anion gap: 15 (ref 5–15)
BUN: 23 mg/dL — ABNORMAL HIGH (ref 6–20)
CO2: 17 mmol/L — ABNORMAL LOW (ref 22–32)
Calcium: 8.7 mg/dL — ABNORMAL LOW (ref 8.9–10.3)
Chloride: 110 mmol/L (ref 98–111)
Creatinine, Ser: 0.99 mg/dL (ref 0.61–1.24)
GFR, Estimated: >60 mL/min (ref >60)
Glucose, Bld: 99 mg/dL (ref 70–99)
Potassium: 4.1 mmol/L (ref 3.5–5.1)
Sodium: 142 mmol/L (ref 135–145)
Total Bilirubin: 1.1 mg/dL (ref 0.3–1.2)
Total Protein: 8 g/dL (ref 6.5–8.1)

## 2022-09-09 MED ORDER — SODIUM CHLORIDE 0.9 % IV BOLUS
1000.0000 mL | Freq: Once | INTRAVENOUS | Status: AC
  Administered 2022-09-09: 1000 mL via INTRAVENOUS

## 2022-09-09 NOTE — ED Notes (Signed)
Gave pt ice water °

## 2022-12-29 ENCOUNTER — Encounter (HOSPITAL_COMMUNITY): Payer: Self-pay

## 2022-12-29 ENCOUNTER — Emergency Department (HOSPITAL_COMMUNITY)
Admission: EM | Admit: 2022-12-29 | Discharge: 2022-12-29 | Disposition: A | Discharge status: Home / Self Care | Payer: Medicaid Other | Attending: Emergency Medicine | Admitting: Emergency Medicine

## 2022-12-29 ENCOUNTER — Other Ambulatory Visit: Payer: Self-pay

## 2022-12-29 DIAGNOSIS — K0889 Other specified disorders of teeth and supporting structures: Secondary | ICD-10-CM | POA: Diagnosis not present

## 2022-12-29 MED ORDER — PENICILLIN V POTASSIUM 500 MG PO TABS: 500.0000 mg | ORAL_TABLET | Freq: Four times a day (QID) | ORAL | 0 refills | Status: AC

## 2022-12-29 MED ORDER — KETOROLAC TROMETHAMINE 30 MG/ML IJ SOLN
30.0000 mg | Freq: Once | INTRAMUSCULAR | Status: AC
  Administered 2022-12-29: 30 mg via INTRAMUSCULAR
  Filled 2022-12-29: qty 1

## 2022-12-29 MED ORDER — PENICILLIN V POTASSIUM 250 MG PO TABS
500.0000 mg | ORAL_TABLET | Freq: Once | ORAL | Status: AC
  Administered 2022-12-29: 500 mg via ORAL
  Filled 2022-12-29: qty 2

## 2022-12-29 NOTE — ED Provider Notes (Signed)
  [facility]-EMERGENCY DEPT [facility] Emergency Department Provider Note MRN:  [id number]  Arrival date & time: 12/29/22     Chief Complaint   Dental Pain   History of Present Illness   Randy Fry is a 37 y.o. year-old male presents to the ED with chief complaint of dental pain that started last night. It is his upper right rear molar that is hurting him. He has not taken anything for his symptoms.  He reports moderate pain.  He is requesting a referral to a dentist.  He reports some subjective fevers and chills.    History provided by patient.   Review of Systems  Pertinent positive and negative review of systems noted in HPI.    Physical Exam   Vitals:   12/29/22 0238  BP: 116/81  Pulse: (!) 110  Resp: 16  Temp: 98.2 F (36.8 C)  SpO2: 99%    CONSTITUTIONAL:  well-appearing, NAD NEURO:  Alert and oriented x 3, CN 3-12 grossly intact EYES:  eyes equal and reactive ENT/NECK:  Supple, no stridor, poor dentition, no abscess CARDIO: normal rate on my exam, regular rhythm, appears well-perfused  PULM:  No respiratory distress,  GI/GU:  non-distended,  MSK/SPINE:  No gross deformities, no edema, moves all extremities  SKIN:  no rash, atraumatic   *Additional and/or pertinent findings included in MDM below  Diagnostic and Interventional Summary    EKG Interpretation Date/Time:    Ventricular Rate:    PR Interval:    QRS Duration:    QT Interval:    QTC Calculation:   R Axis:      Text Interpretation:         Labs Reviewed - No data to display  No orders to display    Medications  ketorolac (TORADOL) 30 MG/ML injection 30 mg (has no administration in time range)  penicillin v potassium (VEETID) tablet 500 mg (has no administration in time range)     Procedures  /  Critical Care Procedures  ED Course and Medical Decision Making  I have reviewed the triage vital signs, the nursing notes, and pertinent available records from the EMR.  Social  Determinants Affecting Complexity of Care: Patient has no clinically significant social determinants affecting this chief complaint..   ED Course:    Medical Decision Making        Consultants: No consultations were needed in caring for this patient.   Treatment and Plan: Emergency department workup does not suggest an emergent condition requiring admission or immediate intervention beyond  what has been performed at this time. The patient is safe for discharge and has  been instructed to return immediately for worsening symptoms, change in  symptoms or any other concerns    Final Clinical Impressions(s) / ED Diagnoses     ICD-10-CM   1. Pain, dental  K08.89       ED Discharge Orders          Ordered    penicillin v potassium (VEETID) 500 MG tablet  4 times daily        12/29/22 4098              Discharge Instructions Discussed with and Provided to Patient:   Discharge Instructions   None      Roxy Horseman, PA-C 12/29/22 1191    Nira Conn, MD 12/29/22 [phone]

## 2022-12-29 NOTE — ED Triage Notes (Signed)
Patient reports right upper toothache, requesting dental referal

## 2023-01-21 ENCOUNTER — Emergency Department (HOSPITAL_COMMUNITY)
Admission: EM | Admit: 2023-01-21 | Discharge: 2023-01-22 | Disposition: A | Discharge status: Home / Self Care | Payer: Medicaid Other | Attending: Emergency Medicine | Admitting: Emergency Medicine

## 2023-01-21 ENCOUNTER — Encounter (HOSPITAL_COMMUNITY): Payer: Self-pay

## 2023-01-21 DIAGNOSIS — K644 Residual hemorrhoidal skin tags: Secondary | ICD-10-CM | POA: Diagnosis not present

## 2023-01-21 DIAGNOSIS — K5900 Constipation, unspecified: Secondary | ICD-10-CM | POA: Insufficient documentation

## 2023-01-21 NOTE — ED Triage Notes (Signed)
Pt is coming in for constipation that has been present for around 2 to 3 days. Has no other complaints at this time. He is otherwise stable

## 2023-01-21 NOTE — ED Notes (Signed)
Patient's name called in the waiting room 4 times to be roomed with no response. Charge RN is aware.

## 2023-01-22 MED ORDER — HYDROCORTISONE ACETATE 25 MG RE SUPP: 25.0000 mg | Freq: Two times a day (BID) | RECTAL | 0 refills | Status: DC

## 2023-01-22 MED ORDER — FLEET ENEMA RE ENEM
1.0000 | ENEMA | Freq: Once | RECTAL | Status: AC
  Administered 2023-01-22: 1 via RECTAL
  Filled 2023-01-22: qty 1

## 2023-01-22 NOTE — ED Provider Notes (Signed)
[place] EMERGENCY DEPARTMENT AT [facility] Provider Note   CSN: [id number] Arrival date & time: 01/21/23  1659     History  Chief Complaint  Patient presents with   Constipation    Randy Fry is a 37 y.o. male.  The history is provided by the patient.  Constipation Randy Fry is a 36 y.o. male who presents to the Emergency Department complaining of Constipation.  He presents to the emergency department for evaluation of 2 days of constipation.  He is unsure when his last bowel movement was.  He tried to have a bowel movement today and strain and there is only a little bit that came out.  No abdominal pain, fevers, nausea, vomiting.  No rectal pain.  He has been taking MiraLAX since June without relief of the symptoms.  He has not tried any additional agents.      Home Medications Prior to Admission medications   Medication Sig Start Date End Date Taking? Authorizing Provider  hydrocortisone (ANUSOL-HC) 25 MG suppository Place 1 suppository (25 mg total) rectally 2 (two) times daily. 01/22/23  Yes Tilden Fossa, MD  polyethylene glycol powder [facility]) 17 GM/SCOOP powder Take 17 g by mouth daily. 08/24/22   Small, Brooke L, PA      Allergies    Patient has no known allergies.    Review of Systems   Review of Systems  Gastrointestinal:  Positive for constipation.  All other systems reviewed and are negative.   Physical Exam Updated Vital Signs BP 106/64 (BP Location: Right Arm)   Pulse (!) 58   Temp 98.2 F (36.8 C) (Oral)   Resp 18   SpO2 100%  Physical Exam Vitals and nursing note reviewed.  Constitutional:      Appearance: He is well-developed.  HENT:     Head: Normocephalic and atraumatic.  Cardiovascular:     Rate and Rhythm: Normal rate and regular rhythm.  Pulmonary:     Effort: Pulmonary effort is normal. No respiratory distress.  Abdominal:     Palpations: Abdomen is soft.     Tenderness: There is no abdominal  tenderness. There is no guarding or rebound.  Genitourinary:    Comments: Nontender rectal examination with external nonthrombosed, nontender hemorrhoids.  No stool in rectal vault.  Nurse, Greig Castilla as chaperone. Musculoskeletal:        General: No tenderness.  Skin:    General: Skin is warm and dry.  Neurological:     Mental Status: He is alert and oriented to person, place, and time.  Psychiatric:        Behavior: Behavior normal.     ED Results / Procedures / Treatments   Labs (all labs ordered are listed, but only abnormal results are displayed) Labs Reviewed - No data to display  EKG None  Radiology No results found.  Procedures Procedures    Medications Ordered in ED Medications  sodium phosphate (FLEET) enema 1 enema (1 enema Rectal Given 01/22/23 0351)    ED Course/ Medical Decision Making/ A&P                                 Medical Decision Making Risk OTC drugs. Prescription drug management.   Patient here for evaluation of constipation despite using MiraLAX at home.  On examination he has external hemorrhoids, soft and nontender abdomen.  No fecal impaction on DRE.  He was treated with a Fleet enema  in the emergency department with return of significant stool.  Patient feeling improved after treatment.  Discussed home care for constipation.  Also will provide treatment for hemorrhoids.  Discussed outpatient follow-up and return precautions.        Final Clinical Impression(s) / ED Diagnoses Final diagnoses:  Constipation, unspecified constipation type  External hemorrhoids    Rx / DC Orders ED Discharge Orders          Ordered    hydrocortisone (ANUSOL-HC) 25 MG suppository  2 times daily        01/22/23 0532              Tilden Fossa, MD 01/22/23 [phone]

## 2023-01-22 NOTE — ED Notes (Signed)
  Patient tolerated fleet enema well and had significant solid bowel movement.

## 2023-05-02 ENCOUNTER — Emergency Department (HOSPITAL_COMMUNITY)
Admission: EM | Admit: 2023-05-02 | Discharge: 2023-05-03 | Disposition: A | Discharge status: Home / Self Care | Attending: Emergency Medicine | Admitting: Emergency Medicine

## 2023-05-02 ENCOUNTER — Encounter (HOSPITAL_COMMUNITY): Payer: Self-pay

## 2023-05-02 ENCOUNTER — Other Ambulatory Visit: Payer: Self-pay

## 2023-05-02 DIAGNOSIS — K5900 Constipation, unspecified: Secondary | ICD-10-CM | POA: Insufficient documentation

## 2023-05-02 NOTE — ED Triage Notes (Signed)
 Pt came in via POV d/t constipation, LBM was 2 weeks ago, denies passing any gas. Does have HA, denies abd pain .

## 2023-05-03 MED ORDER — FLEET ENEMA RE ENEM
1.0000 | ENEMA | Freq: Once | RECTAL | Status: AC
  Administered 2023-05-03: 1 via RECTAL
  Filled 2023-05-03: qty 1

## 2023-05-03 MED ORDER — HYDROCORTISONE ACETATE 25 MG RE SUPP: 25.0000 mg | Freq: Two times a day (BID) | RECTAL | 0 refills | Status: DC

## 2023-05-03 MED ORDER — ACETAMINOPHEN 325 MG PO TABS
650.0000 mg | ORAL_TABLET | Freq: Once | ORAL | Status: AC
  Administered 2023-05-03: 650 mg via ORAL
  Filled 2023-05-03: qty 2

## 2023-05-03 NOTE — ED Provider Notes (Signed)
 [place] EMERGENCY DEPARTMENT AT [facility] Provider Note   CSN: [id number] Arrival date & time: 05/02/23  1714     History  Chief Complaint  Patient presents with   Constipation    Randy Fry is a 38 y.o. male.  38 year old male with a history of constipation presents to the emergency department for evaluation of issues with constipation for the past 2 weeks.  He states that his last bowel movement was 2 weeks ago; however, he did have a small bowel movement while in the waiting room.  States that his bowel movement today was "not for feeling".  He is requesting an enema for symptomatic relief, similar to when he presented for this issue in November.  Denies fever, vomiting, melena, hematochezia, abdominal pain.  Also requesting Tylenol for a headache.  The history is provided by the patient. No language interpreter was used.  Constipation      Home Medications Prior to Admission medications   Medication Sig Start Date End Date Taking? Authorizing Provider  hydrocortisone (ANUSOL-HC) 25 MG suppository Place 1 suppository (25 mg total) rectally 2 (two) times daily. 05/03/23   Antony Madura, PA-C  polyethylene glycol powder (GLYCOLAX/MIRALAX) 17 GM/SCOOP powder Take 17 g by mouth daily. 08/24/22   Small, Brooke L, PA      Allergies    Patient has no known allergies.    Review of Systems   Review of Systems  Gastrointestinal:  Positive for constipation.  Ten systems reviewed and are negative for acute change, except as noted in the HPI.    Physical Exam Updated Vital Signs BP 116/75   Pulse 99   Temp 98 F (36.7 C)   Resp 18   Ht 5\' 7"  (1.702 m)   Wt 68 kg   SpO2 100%   BMI 23.49 kg/m   Physical Exam Vitals and nursing note reviewed.  Constitutional:      General: He is not in acute distress.    Appearance: He is well-developed. He is not diaphoretic.     Comments: Nontoxic appearing and in NAD  HENT:     Head: Normocephalic and atraumatic.   Eyes:     General: No scleral icterus.    Conjunctiva/sclera: Conjunctivae normal.  Pulmonary:     Effort: Pulmonary effort is normal. No respiratory distress.  Abdominal:     Palpations: Abdomen is soft. There is no mass.     Tenderness: There is no guarding.     Comments: Soft, nontender abdomen.   Musculoskeletal:        General: Normal range of motion.     Cervical back: Normal range of motion.  Skin:    General: Skin is warm and dry.     Coloration: Skin is not pale.     Findings: No erythema or rash.  Neurological:     Mental Status: He is alert and oriented to person, place, and time.  Psychiatric:        Mood and Affect: Affect is flat.        Behavior: Behavior is withdrawn.     ED Results / Procedures / Treatments   Labs (all labs ordered are listed, but only abnormal results are displayed) Labs Reviewed - No data to display  EKG None  Radiology No results found.  Procedures Procedures    Medications Ordered in ED Medications  acetaminophen (TYLENOL) tablet 650 mg (650 mg Oral Given 05/03/23 0350)  sodium phosphate (FLEET) enema 1 enema (1 enema Rectal  Given 05/03/23 0350)    ED Course/ Medical Decision Making/ A&P Clinical Course as of 05/03/23 0536  Tue May 03, 2023  0510 Patient feeling better after enema [KH]    Clinical Course User Index [KH] Antony Madura, PA-C                                 Medical Decision Making Risk OTC drugs. Prescription drug management.   This patient presents to the ED for concern of constipation, this involves an extensive number of treatment options, and is a complaint that carries with it a high risk of complications and morbidity.  The differential diagnosis includes constipation vs fecal impaction vs pSBO/SBO   Co morbidities that complicate the patient evaluation  Constipation    Additional history obtained:  External records from outside source obtained and reviewed including Xray in June 2024 in the  setting of constipation; nonobstructive.   Cardiac Monitoring:  The patient was maintained on a cardiac monitor.  I personally viewed and interpreted the cardiac monitored which showed an underlying rhythm of: NSR   Medicines ordered and prescription drug management:  I ordered medication including Fleet enema for constipation. Tylenol ordered for pain/headache.  Reevaluation of the patient after these medicines showed that the patient improved I have reviewed the patients home medicines and have made adjustments as needed   Test Considered:  Xray abdomen   Problem List / ED Course:  As above Reports constipation x 2 weeks; however had BM in the waiting room. No vomiting, abdominal TTP. No clinical signs of obstruction. Patient reporting further improvement after enema.   Reevaluation:  After the interventions noted above, I reevaluated the patient and found that they have : remained stable   Social Determinants of Health:  Housing instability   Dispostion:  After consideration of the diagnostic results and the patients response to treatment, I feel that the patent would benefit from bowel regimen, PCP follow up. Return precautions discussed and provided. Patient discharged in stable condition with no unaddressed concerns.          Final Clinical Impression(s) / ED Diagnoses Final diagnoses:  Constipation, unspecified constipation type    Rx / DC Orders ED Discharge Orders          Ordered    hydrocortisone (ANUSOL-HC) 25 MG suppository  2 times daily        05/03/23 0456              Antony Madura, PA-C 05/03/23 0539    Anders Simmonds T, DO 05/03/23 2323

## 2023-05-03 NOTE — Discharge Instructions (Addendum)
 We recommend daily MiraLAX to help promote soft stool.  Follow-up with a primary care doctor for further evaluation of your symptoms.

## 2023-12-02 ENCOUNTER — Other Ambulatory Visit: Payer: Self-pay

## 2023-12-02 ENCOUNTER — Emergency Department (HOSPITAL_COMMUNITY): Admission: EM | Admit: 2023-12-02 | Discharge: 2023-12-02 | Disposition: A | Discharge status: Home / Self Care

## 2023-12-02 ENCOUNTER — Emergency Department (HOSPITAL_COMMUNITY)

## 2023-12-02 DIAGNOSIS — R109 Unspecified abdominal pain: Secondary | ICD-10-CM | POA: Diagnosis not present

## 2023-12-02 DIAGNOSIS — K5909 Other constipation: Secondary | ICD-10-CM | POA: Diagnosis not present

## 2023-12-02 DIAGNOSIS — N400 Enlarged prostate without lower urinary tract symptoms: Secondary | ICD-10-CM | POA: Diagnosis not present

## 2023-12-02 DIAGNOSIS — K5900 Constipation, unspecified: Secondary | ICD-10-CM | POA: Diagnosis not present

## 2023-12-02 LAB — I-STAT CHEM 8, ED
BUN: 16 mg/dL (ref 6–20)
Calcium, Ion: 1.11 mmol/L — ABNORMAL LOW (ref 1.15–1.40)
Chloride: 107 mmol/L (ref 98–111)
Creatinine, Ser: 0.8 mg/dL (ref 0.61–1.24)
Glucose, Bld: 101 mg/dL — ABNORMAL HIGH (ref 70–99)
HCT: 42 % (ref 39.0–52.0)
Hemoglobin: 14.3 g/dL (ref 13.0–17.0)
Potassium: 3.4 mmol/L — ABNORMAL LOW (ref 3.5–5.1)
Sodium: 143 mmol/L (ref 135–145)
TCO2: 23 mmol/L (ref 22–32)

## 2023-12-02 LAB — CBC WITH DIFFERENTIAL/PLATELET
Abs Immature Granulocytes: 0.02 K/uL (ref 0.00–0.07)
Basophils Absolute: 0 K/uL (ref 0.0–0.1)
Basophils Relative: 1 %
Eosinophils Absolute: 0.1 K/uL (ref 0.0–0.5)
Eosinophils Relative: 4 %
HCT: 41.3 % (ref 39.0–52.0)
Hemoglobin: 13.5 g/dL (ref 13.0–17.0)
Immature Granulocytes: 1 %
Lymphocytes Relative: 45 %
Lymphs Abs: 1.7 K/uL (ref 0.7–4.0)
MCH: 27.6 pg (ref 26.0–34.0)
MCHC: 32.7 g/dL (ref 30.0–36.0)
MCV: 84.5 fL (ref 80.0–100.0)
Monocytes Absolute: 0.4 K/uL (ref 0.1–1.0)
Monocytes Relative: 10 %
Neutro Abs: 1.4 K/uL — ABNORMAL LOW (ref 1.7–7.7)
Neutrophils Relative %: 39 %
Platelets: 257 K/uL (ref 150–400)
RBC: 4.89 MIL/uL (ref 4.22–5.81)
RDW: 13.4 % (ref 11.5–15.5)
WBC: 3.7 K/uL — ABNORMAL LOW (ref 4.0–10.5)
nRBC: 0 % (ref 0.0–0.2)

## 2023-12-02 LAB — COMPREHENSIVE METABOLIC PANEL WITH GFR
ALT: 14 U/L (ref 0–44)
AST: 14 U/L — ABNORMAL LOW (ref 15–41)
Albumin: 4.1 g/dL (ref 3.5–5.0)
Alkaline Phosphatase: 51 U/L (ref 38–126)
Anion gap: 10 (ref 5–15)
BUN: 12 mg/dL (ref 6–20)
CO2: 23 mmol/L (ref 22–32)
Calcium: 9 mg/dL (ref 8.9–10.3)
Chloride: 108 mmol/L (ref 98–111)
Creatinine, Ser: 0.9 mg/dL (ref 0.61–1.24)
GFR, Estimated: >60 mL/min (ref >60)
Glucose, Bld: 101 mg/dL — ABNORMAL HIGH (ref 70–99)
Potassium: 3.5 mmol/L (ref 3.5–5.1)
Sodium: 141 mmol/L (ref 135–145)
Total Bilirubin: 0.8 mg/dL (ref 0.0–1.2)
Total Protein: 7.2 g/dL (ref 6.5–8.1)

## 2023-12-02 MED ORDER — IOHEXOL 350 MG/ML SOLN
75.0000 mL | Freq: Once | INTRAVENOUS | Status: AC | PRN
  Administered 2023-12-02: 75 mL via INTRAVENOUS

## 2023-12-02 MED ORDER — POLYETHYLENE GLYCOL 3350 17 G PO PACK: 17.0000 g | PACK | Freq: Every day | ORAL | 0 refills | Status: DC

## 2023-12-02 MED ORDER — SENNOSIDES-DOCUSATE SODIUM 8.6-50 MG PO TABS: 1.0000 | ORAL_TABLET | Freq: Every day | ORAL | 1 refills | Status: AC

## 2023-12-02 NOTE — Discharge Instructions (Signed)
 As we discussed, please increase your fluid intake to prevent further episodes of constipation.

## 2023-12-02 NOTE — ED Provider Notes (Signed)
 [place] EMERGENCY DEPARTMENT AT [facility] Provider Note   CSN: [id number] Arrival date & time: 12/02/23  1247     Patient presents with: Constipation and Abdominal Pain   Randy Fry is a 38 y.o. male who presents to the ED for what he reports is constipation over the last week, though he states he had a soft bowel movements yesterday but feels like he still has the urge to defecate.  Has been using MiraLAX , but has been inconsistent with this.  Also has a medical history of hemorrhoids, uses topical medications to manage this.  Was previously been seen for previous episodes of constipation which had to be managed with enema.    Constipation Associated symptoms: abdominal pain   Abdominal Pain Associated symptoms: constipation        Prior to Admission medications   Medication Sig Start Date End Date Taking? Authorizing Provider  polyethylene glycol (MIRALAX ) 17 g packet Take 17 g by mouth daily. 12/02/23  Yes Myriam Dorn BROCKS, PA  senna-docusate (SENOKOT-S) 8.6-50 MG tablet Take 1 tablet by mouth daily. 12/02/23  Yes Myriam Dorn BROCKS, PA  hydrocortisone  (ANUSOL -HC) 25 MG suppository Place 1 suppository (25 mg total) rectally 2 (two) times daily. 05/03/23   Keith Sor, PA-C  polyethylene glycol powder (GLYCOLAX /MIRALAX ) 17 GM/SCOOP powder Take 17 g by mouth daily. 08/24/22   Small, Brooke L, PA    Allergies: Patient has no known allergies.    Review of Systems  Gastrointestinal:  Positive for abdominal pain and constipation.  All other systems reviewed and are negative.   Updated Vital Signs BP 99/73   Pulse 100   Temp 97.6 F (36.4 C)   Resp 16   Ht 5' 7 (1.702 m)   Wt 65.8 kg   SpO2 94%   BMI 22.71 kg/m   Physical Exam Vitals and nursing note reviewed.  Constitutional:      General: He is not in acute distress.    Appearance: Normal appearance.  HENT:     Head: Normocephalic and atraumatic.     Mouth/Throat:     Mouth: Mucous membranes  are moist.     Pharynx: Oropharynx is clear.  Eyes:     Extraocular Movements: Extraocular movements intact.     Conjunctiva/sclera: Conjunctivae normal.     Pupils: Pupils are equal, round, and reactive to light.  Cardiovascular:     Rate and Rhythm: Normal rate and regular rhythm.     Pulses: Normal pulses.     Heart sounds: Normal heart sounds. No murmur heard.    No friction rub. No gallop.  Pulmonary:     Effort: Pulmonary effort is normal.     Breath sounds: Normal breath sounds.  Abdominal:     General: Abdomen is flat. Bowel sounds are normal.     Palpations: Abdomen is soft.     Tenderness: There is no abdominal tenderness.     Comments: Normal present bowel sounds in all 4 quadrants.  Musculoskeletal:        General: Normal range of motion.     Cervical back: Normal range of motion and neck supple.     Right lower leg: No edema.     Left lower leg: No edema.  Skin:    General: Skin is warm and dry.     Capillary Refill: Capillary refill takes less than 2 seconds.  Neurological:     General: No focal deficit present.     Mental Status: He is  alert. Mental status is at baseline.  Psychiatric:        Mood and Affect: Mood normal.     (all labs ordered are listed, but only abnormal results are displayed) Labs Reviewed  COMPREHENSIVE METABOLIC PANEL WITH GFR - Abnormal; Notable for the following components:      Result Value   Glucose, Bld 101 (*)    AST 14 (*)    All other components within normal limits  CBC WITH DIFFERENTIAL/PLATELET - Abnormal; Notable for the following components:   WBC 3.7 (*)    Neutro Abs 1.4 (*)    All other components within normal limits  I-STAT CHEM 8, ED - Abnormal; Notable for the following components:   Potassium 3.4 (*)    Glucose, Bld 101 (*)    Calcium, Ion 1.11 (*)    All other components within normal limits    EKG: None  Radiology: CT ABDOMEN PELVIS W CONTRAST Result Date: 12/02/2023 CLINICAL DATA:  Constipation for  1 week.  Abdominal pain. EXAM: CT ABDOMEN AND PELVIS WITH CONTRAST TECHNIQUE: Multidetector CT imaging of the abdomen and pelvis was performed using the standard protocol following bolus administration of intravenous contrast. RADIATION DOSE REDUCTION: This exam was performed according to the departmental dose-optimization program which includes automated exposure control, adjustment of the mA and/or kV according to patient size and/or use of iterative reconstruction technique. CONTRAST:  75mL OMNIPAQUE IOHEXOL 350 MG/ML SOLN COMPARISON:  Jul 03, 2017. FINDINGS: Lower chest: No acute abnormality. Hepatobiliary: No focal liver abnormality is seen. No gallstones, gallbladder wall thickening, or biliary dilatation. Pancreas: Unremarkable. No pancreatic ductal dilatation or surrounding inflammatory changes. Spleen: Normal in size without focal abnormality. Adrenals/Urinary Tract: Adrenal glands are unremarkable. Interval development of 7 mm low density in upper pole of left kidney most consistent with cyst, but follow-up ultrasound in 1 year is recommended to ensure stability. No hydronephrosis or renal obstruction is noted. Urinary bladder is unremarkable. Stomach/Bowel: Stomach is within normal limits. Appendix appears normal. No evidence of bowel wall thickening, distention, or inflammatory changes. No abnormal stool burden is noted. Vascular/Lymphatic: No significant vascular findings are present. No enlarged abdominal or pelvic lymph nodes. Reproductive: Mild prostatic enlargement is noted. Other: No abdominal wall hernia or abnormality. No abdominopelvic ascites. Musculoskeletal: No acute or significant osseous findings. IMPRESSION: 1. Interval development of 7 mm low density in upper pole of left kidney most consistent with cyst, but follow-up ultrasound in 1 year is recommended to ensure stability. 2. Mild prostatic enlargement. 3. No other abnormality seen in the abdomen or pelvis. Electronically Signed   By:  Lynwood Landy Raddle M.D.   On: 12/02/2023 15:09     Procedures   Medications Ordered in the ED  iohexol (OMNIPAQUE) 350 MG/ML injection 75 mL (75 mLs Intravenous Contrast Given 12/02/23 1422)                                    Medical Decision Making Amount and/or Complexity of Data Reviewed Labs: ordered. Radiology: ordered.  Risk OTC drugs. Prescription drug management.   Medical Decision Making:   Randy Fry is a 38 y.o. male who presented to the ED today with constipation detailed above.     Complete initial physical exam performed, notably the patient  was alert and oriented in no apparent distress.  Abdomen is soft and nontender and physical exam was largely unremarkable..    Reviewed and  confirmed nursing documentation for past medical history, family history, social history.    Initial Assessment:   With the patient's presentation of constipation, most likely diagnosis is functional constipation.  Further considered possible bowel obstruction.   Initial Plan:  Obtain CT imaging of the abdomen to evaluate for bowel obstruction Screening labs including CBC and Metabolic panel to evaluate for infectious or metabolic etiology of disease.  Objective evaluation as below reviewed   Initial Study Results:   Laboratory  All laboratory results reviewed without evidence of clinically relevant pathology.   Exceptions include: None  Radiology:  All images reviewed independently. Agree with radiology report at this time.   CT ABDOMEN PELVIS W CONTRAST Result Date: 12/02/2023 CLINICAL DATA:  Constipation for 1 week.  Abdominal pain. EXAM: CT ABDOMEN AND PELVIS WITH CONTRAST TECHNIQUE: Multidetector CT imaging of the abdomen and pelvis was performed using the standard protocol following bolus administration of intravenous contrast. RADIATION DOSE REDUCTION: This exam was performed according to the departmental dose-optimization program which includes automated exposure control,  adjustment of the mA and/or kV according to patient size and/or use of iterative reconstruction technique. CONTRAST:  75mL OMNIPAQUE IOHEXOL 350 MG/ML SOLN COMPARISON:  Jul 03, 2017. FINDINGS: Lower chest: No acute abnormality. Hepatobiliary: No focal liver abnormality is seen. No gallstones, gallbladder wall thickening, or biliary dilatation. Pancreas: Unremarkable. No pancreatic ductal dilatation or surrounding inflammatory changes. Spleen: Normal in size without focal abnormality. Adrenals/Urinary Tract: Adrenal glands are unremarkable. Interval development of 7 mm low density in upper pole of left kidney most consistent with cyst, but follow-up ultrasound in 1 year is recommended to ensure stability. No hydronephrosis or renal obstruction is noted. Urinary bladder is unremarkable. Stomach/Bowel: Stomach is within normal limits. Appendix appears normal. No evidence of bowel wall thickening, distention, or inflammatory changes. No abnormal stool burden is noted. Vascular/Lymphatic: No significant vascular findings are present. No enlarged abdominal or pelvic lymph nodes. Reproductive: Mild prostatic enlargement is noted. Other: No abdominal wall hernia or abnormality. No abdominopelvic ascites. Musculoskeletal: No acute or significant osseous findings. IMPRESSION: 1. Interval development of 7 mm low density in upper pole of left kidney most consistent with cyst, but follow-up ultrasound in 1 year is recommended to ensure stability. 2. Mild prostatic enlargement. 3. No other abnormality seen in the abdomen or pelvis. Electronically Signed   By: Lynwood Landy Raddle M.D.   On: 12/02/2023 15:09    Reassessment and Plan:   Physical exam is benign as are imaging findings.  Discussed with patient the need to follow-up with primary care regarding incidental findings of renal cyst, need for further imaging.  Will manage constipation with both osmotic laxative, MiraLAX , as well as a stimulant, Senokot.  Also provided  referrals for both GI and for primary care.  Patient understands and agrees to this care plan, has no further concerns at this time.  His vital signs been stable within normal limits, and workup is largely benign, findings stable for discharge.       Final diagnoses:  Other constipation    ED Discharge Orders          Ordered    polyethylene glycol (MIRALAX ) 17 g packet  Daily        12/02/23 1700    senna-docusate (SENOKOT-S) 8.6-50 MG tablet  Daily        12/02/23 1700               Myriam Dorn BROCKS, [place] 12/02/23 1707    Simon,  Lavonia SAILOR, MD 12/03/23 [phone]

## 2023-12-02 NOTE — ED Triage Notes (Signed)
 Pt constipated x 1 week. Was treated with oral laxatives and has not been able to have BM, pt co abd pain without n/v

## 2024-02-17 ENCOUNTER — Encounter (HOSPITAL_COMMUNITY): Payer: Self-pay | Admitting: Emergency Medicine

## 2024-02-17 ENCOUNTER — Emergency Department (HOSPITAL_COMMUNITY)
Admission: EM | Admit: 2024-02-17 | Discharge: 2024-02-18 | Disposition: A | Discharge status: Home / Self Care | Attending: Emergency Medicine | Admitting: Emergency Medicine

## 2024-02-17 DIAGNOSIS — K5904 Chronic idiopathic constipation: Secondary | ICD-10-CM | POA: Diagnosis present

## 2024-02-17 DIAGNOSIS — D72819 Decreased white blood cell count, unspecified: Secondary | ICD-10-CM | POA: Diagnosis not present

## 2024-02-17 LAB — CBC WITH DIFFERENTIAL/PLATELET
Abs Immature Granulocytes: 0.02 K/uL (ref 0.00–0.07)
Basophils Absolute: 0.1 K/uL (ref 0.0–0.1)
Basophils Relative: 2 %
Eosinophils Absolute: 0.2 K/uL (ref 0.0–0.5)
Eosinophils Relative: 5 %
HCT: 40 % (ref 39.0–52.0)
Hemoglobin: 12.7 g/dL — ABNORMAL LOW (ref 13.0–17.0)
Immature Granulocytes: 1 %
Lymphocytes Relative: 47 %
Lymphs Abs: 1.5 K/uL (ref 0.7–4.0)
MCH: 27.4 pg (ref 26.0–34.0)
MCHC: 31.8 g/dL (ref 30.0–36.0)
MCV: 86.2 fL (ref 80.0–100.0)
Monocytes Absolute: 0.3 K/uL (ref 0.1–1.0)
Monocytes Relative: 10 %
Neutro Abs: 1.1 K/uL — ABNORMAL LOW (ref 1.7–7.7)
Neutrophils Relative %: 35 %
Platelets: 244 K/uL (ref 150–400)
RBC: 4.64 MIL/uL (ref 4.22–5.81)
RDW: 14.2 % (ref 11.5–15.5)
WBC: 3.1 K/uL — ABNORMAL LOW (ref 4.0–10.5)
nRBC: 0 % (ref 0.0–0.2)

## 2024-02-17 LAB — COMPREHENSIVE METABOLIC PANEL WITH GFR
ALT: 27 U/L (ref 0–44)
AST: 20 U/L (ref 15–41)
Albumin: 4.3 g/dL (ref 3.5–5.0)
Alkaline Phosphatase: 69 U/L (ref 38–126)
Anion gap: 11 (ref 5–15)
BUN: 14 mg/dL (ref 6–20)
CO2: 22 mmol/L (ref 22–32)
Calcium: 9.2 mg/dL (ref 8.9–10.3)
Chloride: 107 mmol/L (ref 98–111)
Creatinine, Ser: 0.71 mg/dL (ref 0.61–1.24)
GFR, Estimated: >60 mL/min
Glucose, Bld: 92 mg/dL (ref 70–99)
Potassium: 4.5 mmol/L (ref 3.5–5.1)
Sodium: 139 mmol/L (ref 135–145)
Total Bilirubin: <0.2 mg/dL (ref 0.0–1.2)
Total Protein: 6.8 g/dL (ref 6.5–8.1)

## 2024-02-17 NOTE — ED Triage Notes (Signed)
 Patient here to be seen for ongoing constipation. Requesting additional meds and pain meds. Reports that the otc med has not helped. Appears in no distress

## 2024-02-17 NOTE — ED Notes (Signed)
 Called pt x3 to update v/s, no answer.  KM

## 2024-02-17 NOTE — ED Provider Triage Note (Signed)
 Emergency Medicine Provider Triage Evaluation Note  Randy Fry , a 38 y.o. male  was evaluated in triage.  Pt complains of constipation.  Patient states that he has not had a bowel movement in 1 to 2 weeks.  Patient states that he has a history of constipation and takes MiraLAX  for same however has not had any relief.  Patient denies any abdominal pain, nausea, or vomiting.  Patient denies any blood in stool or emesis.  Patient denies any fevers.  Patient is currently in no acute distress.  Review of Systems  Positive: Constipation Negative: Fevers  Physical Exam  BP 122/72   Pulse 93   Temp 98.1 F (36.7 C)   Resp 16   SpO2 100%  Gen:   Awake, no distress   Resp:  Normal effort  MSK:   Moves extremities without difficulty  Other:    Medical Decision Making  Medically screening exam initiated at 2:54 PM.  Appropriate orders placed.  Randy Fry was informed that the remainder of the evaluation will be completed by another provider, this initial triage assessment does not replace that evaluation, and the importance of remaining in the ED until their evaluation is complete.    Randy Fry, [place] 02/17/24 [phone]

## 2024-02-18 MED ORDER — HYDROCORTISONE ACETATE 25 MG RE SUPP: 25.0000 mg | Freq: Two times a day (BID) | RECTAL | 0 refills | Status: AC

## 2024-02-18 MED ORDER — POLYETHYLENE GLYCOL 3350 17 G PO PACK: 17.0000 g | PACK | Freq: Every day | ORAL | 2 refills | Status: AC

## 2024-02-18 MED ORDER — POLYETHYLENE GLYCOL 3350 17 G PO PACK
17.0000 g | PACK | Freq: Every day | ORAL | Status: DC
  Administered 2024-02-18: 17 g via ORAL
  Filled 2024-02-18: qty 1

## 2024-02-18 MED ORDER — POLYETHYLENE GLYCOL 3350 17 GM/SCOOP PO POWD: 17.0000 g | Freq: Every day | ORAL | 0 refills | Status: AC

## 2024-02-18 NOTE — Discharge Instructions (Addendum)
 As discussed, please follow-up primary care.  Seek emergency care if experiencing any new or worsening symptoms.

## 2024-02-18 NOTE — ED Provider Notes (Signed)
 " [place] EMERGENCY DEPARTMENT AT [facility] Provider Note   CSN: [id number] Arrival date & time: 02/17/24  1356     Patient presents with: No chief complaint on file.   Randy Fry is a 38 y.o. male with PMHx constipation, paranoid schizophrenia who presents to ED concerned for constipation. Patient stating that they have not had a BM in 2 weeks. This is a chronic complaint for the patient and they state that they just want miralax  and cream for their hemorrhoids. Patient denies fever, chest pain, nausea, vomiting, diarrhea, abdominal pain.   HPI     Prior to Admission medications  Medication Sig Start Date End Date Taking? Authorizing Provider  hydrocortisone  (ANUSOL -HC) 25 MG suppository Place 1 suppository (25 mg total) rectally 2 (two) times daily. 02/18/24   Hoy Fraction F, PA-C  polyethylene glycol (MIRALAX ) 17 g packet Take 17 g by mouth daily. 02/18/24   Hoy Fraction F, PA-C  polyethylene glycol powder (GLYCOLAX /MIRALAX ) 17 GM/SCOOP powder Take 17 g by mouth daily. 02/18/24   Hoy Fraction F, PA-C  senna-docusate (SENOKOT-S) 8.6-50 MG tablet Take 1 tablet by mouth daily. 12/02/23   Myriam Dorn BROCKS, PA    Allergies: Patient has no known allergies.    Review of Systems  Gastrointestinal:  Positive for constipation.    Updated Vital Signs BP 113/64   Pulse 76   Temp 98.6 F (37 C) (Oral)   Resp 18   SpO2 95%   Physical Exam Vitals and nursing note reviewed.  Constitutional:      General: He is not in acute distress.    Appearance: He is not ill-appearing or toxic-appearing.  HENT:     Head: Normocephalic and atraumatic.     Mouth/Throat:     Mouth: Mucous membranes are moist.  Eyes:     General: No scleral icterus.       Right eye: No discharge.        Left eye: No discharge.     Conjunctiva/sclera: Conjunctivae normal.  Cardiovascular:     Rate and Rhythm: Normal rate and regular rhythm.     Pulses: Normal pulses.      Heart sounds: Normal heart sounds. No murmur heard. Pulmonary:     Effort: Pulmonary effort is normal. No respiratory distress.     Breath sounds: Normal breath sounds. No wheezing, rhonchi or rales.  Abdominal:     General: Abdomen is flat. Bowel sounds are normal. There is no distension.     Palpations: Abdomen is soft. There is no mass.     Tenderness: There is no abdominal tenderness.  Musculoskeletal:     Right lower leg: No edema.     Left lower leg: No edema.  Skin:    General: Skin is warm and dry.     Findings: No rash.  Neurological:     General: No focal deficit present.     Mental Status: He is alert and oriented to person, place, and time. Mental status is at baseline.  Psychiatric:        Mood and Affect: Mood normal.        Behavior: Behavior normal.     (all labs ordered are listed, but only abnormal results are displayed) Labs Reviewed  CBC WITH DIFFERENTIAL/PLATELET - Abnormal; Notable for the following components:      Result Value   WBC 3.1 (*)    Hemoglobin 12.7 (*)    Neutro Abs 1.1 (*)    All other  components within normal limits  COMPREHENSIVE METABOLIC PANEL WITH GFR    EKG: None  Radiology: No results found.   Procedures   Medications Ordered in the ED  polyethylene glycol (MIRALAX  / GLYCOLAX ) packet 17 g (has no administration in time range)                                    Medical Decision Making  This patient presents to the ED for concern of constipation, this involves an extensive number of treatment options, and is a complaint that carries with it a high risk of complications and morbidity.  The differential diagnosis includes fecal impaction, SBO, chronic idiopathic constipation, etc.   Co morbidities that complicate the patient evaluation  Paranoid schizophrenia, constipation   Additional history obtained:  Additional history obtained from prior ED notes: Patient with multiple ED visits for similar  complaint   Problem List / ED Course / Critical interventions / Medication management  Patient presents ED concern for 2 weeks of constipation. States that last BM was 2 weeks ago. Patient stating that this is chronic and he is not having any abdominal pain or other symptoms today and just wants his MiraLAX  and hemorrhoid suppositories.  Patient declining further workup in ED.  I do feel that this is appropriate given the chronicity of his constipation and lack of symptoms today. I Ordered, and personally interpreted labs.  CBC without leukocytosis.  There is mild anemia with hemoglobin of 12.7.  CMP reassuring. Prescribed patient's medicines as requested.  Patient agrees to follow-up with PCP.   I have reviewed the patients home medicines and have made adjustments as needed The patient has been appropriately medically screened and/or stabilized in the ED. I have low suspicion for any other emergent medical condition which would require further screening, evaluation or treatment in the ED or require inpatient management. At time of discharge the patient is hemodynamically stable and in no acute distress. I have discussed work-up results and diagnosis with patient and answered all questions. Patient is agreeable with discharge plan. We discussed strict return precautions for returning to the emergency department and they verbalized understanding.     Social Determinants of Health:  none      Final diagnoses:  Chronic idiopathic constipation    ED Discharge Orders          Ordered    polyethylene glycol (MIRALAX ) 17 g packet  Daily        02/18/24 1001    hydrocortisone  (ANUSOL -HC) 25 MG suppository  2 times daily        02/18/24 1001    polyethylene glycol powder (GLYCOLAX /MIRALAX ) 17 GM/SCOOP powder  Daily        02/18/24 1001               Hoy Nidia FALCON, [place] 02/18/24 1007  "

## 2024-02-18 NOTE — ED Notes (Signed)
 Pt states he eats fruit and greens, and drinks water.  Pt has tried OTC remedies like Miralax   without success.  Pt hasn't tried an enema.  Pt states discomfort is throughout abdomen.  Last BM two weeks ago.

## 2024-02-18 NOTE — ED Notes (Addendum)
 Pt was removed OTF prior to this NT and Ava's arrival. We were able to get him back on the board and vitals are good and up to date.
